# Patient Record
Sex: Female | Born: 1990
Health system: Southern US, Community
[De-identification: ages and names within clinical notes are randomized; demographics above are authoritative.]

## PROBLEM LIST (undated history)

## (undated) DIAGNOSIS — J302 Other seasonal allergic rhinitis: Secondary | ICD-10-CM

## (undated) DIAGNOSIS — F419 Anxiety disorder, unspecified: Secondary | ICD-10-CM

## (undated) DIAGNOSIS — R7302 Impaired glucose tolerance (oral): Secondary | ICD-10-CM

## (undated) HISTORY — DX: Other seasonal allergic rhinitis: J30.2

## (undated) HISTORY — PX: TONSILLECTOMY AND ADENOIDECTOMY: SUR1326

## (undated) HISTORY — DX: Anxiety disorder, unspecified: F41.9

## (undated) HISTORY — DX: Impaired glucose tolerance (oral): R73.02

---
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
## 2012-06-28 ENCOUNTER — Ambulatory Visit: Payer: Self-pay | Admitting: Specialist

## 2012-07-21 ENCOUNTER — Ambulatory Visit: Payer: Self-pay | Admitting: Specialist

## 2012-07-27 ENCOUNTER — Ambulatory Visit: Payer: Self-pay | Admitting: Specialist

## 2012-08-27 ENCOUNTER — Ambulatory Visit: Payer: Self-pay | Admitting: Specialist

## 2012-08-27 HISTORY — PX: POLYSOMNOGRAPHY: SHX453

## 2012-09-04 ENCOUNTER — Ambulatory Visit (INDEPENDENT_AMBULATORY_CARE_PROVIDER_SITE_OTHER): Payer: 59 | Admitting: Family Medicine

## 2012-09-04 ENCOUNTER — Encounter: Payer: Self-pay | Admitting: Family Medicine

## 2012-09-04 VITALS — BP 137/85 | HR 79 | Temp 98.2°F | Resp 16 | Ht 67.0 in | Wt 293.0 lb

## 2012-09-04 DIAGNOSIS — E270 Other adrenocortical overactivity: Secondary | ICD-10-CM

## 2012-09-04 DIAGNOSIS — F411 Generalized anxiety disorder: Secondary | ICD-10-CM

## 2012-09-04 DIAGNOSIS — E669 Obesity, unspecified: Secondary | ICD-10-CM

## 2012-09-04 DIAGNOSIS — R946 Abnormal results of thyroid function studies: Secondary | ICD-10-CM

## 2012-09-04 DIAGNOSIS — R7989 Other specified abnormal findings of blood chemistry: Secondary | ICD-10-CM

## 2012-09-04 DIAGNOSIS — R7309 Other abnormal glucose: Secondary | ICD-10-CM

## 2012-09-04 MED ORDER — ALPRAZOLAM 0.5 MG PO TABS: 0.5000 mg | ORAL_TABLET | Freq: Every evening | ORAL | Status: DC | PRN

## 2012-09-04 NOTE — Progress Notes (Signed)
[address]   [place], [place]  [address]   [phone]  Subjective:    Patient ID: Jill Ortiz, female    DOB: 08-25-1990, 22 y.o.   MRN: [id number]  HPI This 22 y.o. female presents to establish care and for the following concerns:  1.  Bariatric surgery:  Scheduled for October 10, 2012.  Working with Eunice Blase.  Dr. Smitty Cords in [place] to complete surgery.  Had to complete pre-operative requirements at office in [place]; surgery at [facility].  +Gastric Bypass.  Will be out of work for two weeks.  Three weeks of one meal a day and the remainder of meals protein shakes.    2.  Thyroid abnormality:  TSH in 06/2012 of 5.770.  Grandmother with hypothyroidism.  Has suffered with borderline thyroid readings for a few years.  Surgeon worried that thyroid abnormality will interfere with weight loss after gastric bypass.  Cortisol level was slightly elevated on recent labs; asked Dr. Sullivan Lone if that indicated anything; he suggested that there may be issue with pituitary-hypothalamic process.    3.  Glucose Intolerance:  Recent labs did reveal glucose intolerance.  This further motivates patient to undergo gastric bypass for weight loss.  Wants to stay healthy and avoid developing obesity related health issues.  4.  Anxiety:  Zoloft x 1 year after death of mother and weaned off; taking Lorazepam 1-2 times per month.  Has never taken Xanax.  Has a new nephew.  Things going well now; coping with stressors well.  Has a serious boyfriend.  Good family support.  5. Health Maintenance:  Pap smear scheduled today; Levin Erp.  Plans to do IUD. Mammogram never. Colonoscopy never. TDAP 2010. Flu vaccine 2013. Hepatitis B series in past 6th grade. Eye exam 2012; no contacts; +reading glasses. Dental exam 6 months ago.   Review of Systems  Constitutional: Positive for unexpected weight change. Negative for fever, chills, diaphoresis, activity change, appetite change and fatigue.  Respiratory: Negative for  shortness of breath.   Cardiovascular: Negative for chest pain, palpitations and leg swelling.  Gastrointestinal: Negative for nausea, vomiting, abdominal pain, diarrhea and constipation.  Endocrine: Negative for cold intolerance, heat intolerance, polydipsia, polyphagia and polyuria.  Musculoskeletal: Negative for back pain, arthralgias and gait problem.  Skin: Negative for rash.  Neurological: Negative for dizziness, seizures, syncope, facial asymmetry, speech difficulty, light-headedness, numbness and headaches.  Psychiatric/Behavioral: Negative for confusion, sleep disturbance, dysphoric mood and decreased concentration. The patient is not nervous/anxious.     Past Medical History  Diagnosis Date  . Seasonal allergies   . Anxiety   . Glucose intolerance (impaired glucose tolerance)     Past Surgical History  Procedure Laterality Date  . Tonsillectomy and adenoidectomy      Prior to Admission medications   Medication Sig Start Date End Date Taking? Authorizing Provider  LORazepam (ATIVAN) 0.5 MG tablet Take 0.5 mg by mouth every 8 (eight) hours.   Yes Historical Provider, MD  norethindrone-ethinyl estradiol (JUNEL FE,GILDESS FE,LOESTRIN FE) 1-20 MG-MCG tablet Take 1 tablet by mouth daily.   Yes Historical Provider, MD  ALPRAZolam Prudy Feeler) 0.5 MG tablet Take 1 tablet (0.5 mg total) by mouth at bedtime as needed for sleep. 09/04/12   Ethelda Chick, MD    No Known Allergies  History   Social History  . Marital Status: Single    Spouse Name: N/A    Number of Children: N/A  . Years of Education: N/A   Occupational History  . CMA  [employer]   Social History Main Topics  . Smoking status: Never Smoker   . Smokeless tobacco: Not on file  . Alcohol Use: Yes  . Drug Use: No  . Sexually Active: Not on file   Other Topics Concern  . Not on file   Social History Narrative   Marital status: dating seriously x 2 years.      Children; none      Lives: with dad, boyfriend.       Employment: [employer] x 2 years; happy.  Works with Eunice Blase       Tobacco: none      Alcohol: socially      Drugs; none      Exercise:  1-2 times per week.    Family History  Problem Relation Age of Onset  . Diabetes Mother   . Hyperlipidemia Mother   . Hypertension Mother   . COPD Mother   . Heart disease Mother     heart arrhythmia; V. Tach.  CHF  . Diabetes Maternal Grandmother   . Diabetes Paternal Grandmother   . Hyperlipidemia Father   .          Objective:   Physical Exam  Nursing note and vitals reviewed. Constitutional: She is oriented to person, place, and time. She appears well-developed and well-nourished. No distress.  HENT:  Head: Normocephalic and atraumatic.  Right Ear: External ear normal.  Left Ear: External ear normal.  Nose: Nose normal.  Mouth/Throat: Oropharynx is clear and moist.  Eyes: Conjunctivae and EOM are normal. Pupils are equal, round, and reactive to light.  Neck: Normal range of motion. Neck supple. No JVD present. No thyromegaly present.  Cardiovascular: Normal rate, regular rhythm, normal heart sounds and intact distal pulses.  Exam reveals no gallop and no friction rub.   No murmur heard. Pulmonary/Chest: Effort normal and breath sounds normal. She has no wheezes. She has no rales.  Abdominal: Soft. Bowel sounds are normal. She exhibits no mass. There is no tenderness. There is no rebound and no guarding.  Musculoskeletal:       Right shoulder: Normal.       Left shoulder: Normal.       Cervical back: Normal.       Lumbar back: Normal.  Lymphadenopathy:    She has no cervical adenopathy.  Neurological: She is alert and oriented to person, place, and time. She has normal reflexes. No cranial nerve deficit. She exhibits normal muscle tone. Coordination normal.  Skin: Skin is warm and dry. No rash noted. She is not diaphoretic.  Psychiatric: She has a normal mood and affect. Her behavior is normal. Judgment and thought content normal.        Assessment & Plan:  Thyroid function study abnormality - Plan: Ambulatory referral to Endocrinology  Elevated cortisol level - Plan: Ambulatory referral to Endocrinology  Other abnormal glucose  Generalized anxiety disorder  Obesity, unspecified   1. Thyroid abnormality:  Chronic/persistent; mild with minimal to no symptoms; with elevated Cortisol level, recommend referral to endocrinology. 2.  Elevated Cortisol Level:  New. Associated with slightly abnormal TSH; refer to endocrinology to rule out underlying process. 3.  Glucose intolerance:  New.  Recommend weight loss, exercise, low-carb and low-sugar intake.  Scheduled for gastric bypass 10/10/12. 4.  Generalized anxiety disorder: improved recently; has weaned off of Zoloft; requesting trial of Xanax for sparing use.  Rx provided.  To replace Ativan. 5. Obesity: chronic condition; scheduled for gastric bypass on 10/10/12; good  family support . Encourage surgery.  Meds ordered this encounter  Medications  . norethindrone-ethinyl estradiol (JUNEL FE,GILDESS FE,LOESTRIN FE) 1-20 MG-MCG tablet    Sig: Take 1 tablet by mouth daily.  Marland Kitchen LORazepam (ATIVAN) 0.5 MG tablet    Sig: Take 0.5 mg by mouth every 8 (eight) hours.  . ALPRAZolam (XANAX) 0.5 MG tablet    Sig: Take 1 tablet (0.5 mg total) by mouth at bedtime as needed for sleep.    Dispense:  30 tablet    Refill:  2

## 2012-09-12 ENCOUNTER — Telehealth: Payer: Self-pay

## 2012-09-12 NOTE — Telephone Encounter (Signed)
PT STATES SHE IS HAVING GASTRIC BYPASS SURGERY AND NEED A LETTER SAYING HER TSH LEVEL WON'T AFFECT THE SURGERY PLEASE CALL [phone]

## 2012-09-13 NOTE — Telephone Encounter (Signed)
Generated note regarding thyroid function; letter sent to patient via MyChart.  Also contacted patient for fax number; she does not have a fax number to fax letter; she will personally fax letter or contact office if she needs it faxed directly from [facility].  Has appointment in three weeks with endocrinology; hopes to undergo gastric bypass in four week.s

## 2012-09-13 NOTE — Telephone Encounter (Signed)
Pt is calling back stating that she is in need of the letter mentioned in the previous message asap. Best# [phone]

## 2012-09-26 ENCOUNTER — Ambulatory Visit: Payer: Self-pay | Admitting: Specialist

## 2012-09-26 HISTORY — PX: OTHER SURGICAL HISTORY: SHX169

## 2012-10-02 ENCOUNTER — Encounter: Payer: Self-pay | Admitting: Internal Medicine

## 2012-10-02 ENCOUNTER — Ambulatory Visit (INDEPENDENT_AMBULATORY_CARE_PROVIDER_SITE_OTHER): Payer: 59 | Admitting: Internal Medicine

## 2012-10-02 VITALS — BP 112/68 | HR 90 | Temp 98.2°F | Resp 12 | Ht 66.25 in | Wt 288.0 lb

## 2012-10-02 DIAGNOSIS — E039 Hypothyroidism, unspecified: Secondary | ICD-10-CM | POA: Insufficient documentation

## 2012-10-02 NOTE — Patient Instructions (Addendum)
Please return in 3 months. At that time, we might need to check for excess cortisol production (by a 24h urine collection). I will let you know the results of your tests by MyChart.

## 2012-10-02 NOTE — Progress Notes (Addendum)
Subjective:     Patient ID: Jill Ortiz, female   DOB: Apr 14, 1990, 22 y.o.   MRN: [id number]  HPI Ms. Jill Ortiz is a pleasant 22 y/o woman referred by her PCP, Dr. Nilda Simmer at [facility] for management of hypothyrodism. The patient has an upcoming gastric bypass surgery scheduled for a week from now.  Patient has been diagnosed with hypothyroidism in 2009, but she was not treated at that time as she did not have a PCP then;  in 06/2012, when the TSH returned at 5.770. She remembers that the TSH value was lower than this in 2009.  Pt c/o: - weight gain, but recent weight loss (15 lbs in last mo per our records): fluctuated weight all her life, could lose it but regained it; gained 35 lbs since her mom diet, lost 10-15 lbs on recent diet that prepares her for the upcoming GBP. She also exercises by walking 2x a week. - no constipation - + alternates between cold and hot intolerance (but usually feels hot) - no dry skin - + breaking fingernails - no hair falling - no mental fogginess - no hypersomnia  No lumps in throat, hoarseness, odyno-/dysphagia.   She has a family history of hypothyroidism in her grandmother. No known FH of autoimmune disorders.  The patient also had a slightly elevated 8 am cortisol level of 26.5 (6.2-19.4) 3 months ago. Of note, at that time, she was on oral contraceptives, she stopped them afterwards, and now she has Mirena IUD in place for the last week.  She denies sudden weight gain, increased anxiety/depression lately - she has depression after the death of her mom in January 28, 2013and was on SSRI for one year, but she took herself off them approximately 6 months ago, hypertension, hyperlipidemia, wide purple striae, isolated central obesity, acne, hirsutism, or irregular menstrual cycles prior to starting the contraceptives. She does have impaired fasting glucose, and an extensive family history of diabetes.  PMH: She also has a medical history of  generalized anxiety disorder-on benzodiazepines, obesity-with a BMI of 45 and the gastric bypass (R en Y) scheduled on 10/10/2012 with Dr. Smitty Cords in [place], hyperglycemia.  Patient brings the results from 07/17/2012 at this appointment, and we reviewed them together: Hemoglobin A1c 5.9% 25-hydroxy vitamin D 40 Vitamin B1 129 Amylase, lipase, magnesium, ferritin, pre-albumin, H. pylori antibodies, Ferritin, INR, phosphorus, B12, and folate are all normal CMP normal except glucose high at 128 (fasting) CBC with differential normal  Review of Systems Constitutional: + weight gain/loss, + fatigue, no subjective hyperthermia/hypothermia Eyes: no blurry vision, no xerophthalmia ENT: no sore throat, no nodules palpated in throat, no dysphagia/odynophagia, no hoarseness Cardiovascular: no CP/SOB/palpitations/leg swelling Respiratory: no cough/SOB Gastrointestinal: no N/V/D/C Musculoskeletal: no muscle/joint aches Skin: no rashes Neurological: no tremors/numbness/tingling/dizziness Psychiatric: + depression/anxiety  Past Medical History  Diagnosis Date  . Seasonal allergies   . Anxiety   . Glucose intolerance (impaired glucose tolerance)    Past Surgical History  Procedure Laterality Date  . Tonsillectomy and adenoidectomy     History   Social History  . Marital Status: Single    Spouse Name: N/A    Number of Children: 0   Occupational History  . CMA at [employer]   Social History Main Topics  . Smoking status: Never Smoker   . Smokeless tobacco: Not on file  . Alcohol Use: Yes  . Drug Use: No   Social History Narrative   Marital status: dating seriously  x 2 years.      Children; none      Lives: with dad, boyfriend.      Employment: [employer] x 2 years; happy.  Works with Eunice Blase       Tobacco: none      Alcohol: socially      Drugs; none      Exercise:  1-2 times per week.   Current Outpatient Prescriptions on File Prior to Visit  Medication  Sig Dispense Refill  . ALPRAZolam (XANAX) 0.5 MG tablet Take 1 tablet (0.5 mg total) by mouth at bedtime as needed for sleep.  30 tablet  2  . LORazepam (ATIVAN) 0.5 MG tablet Take 0.5 mg by mouth every 8 (eight) hours.      . norethindrone-ethinyl estradiol (JUNEL FE,GILDESS FE,LOESTRIN FE) 1-20 MG-MCG tablet Take 1 tablet by mouth daily.       No current facility-administered medications on file prior to visit.   No Known Allergies  Family History  Problem Relation Age of Onset  . Diabetes Mother   . Hyperlipidemia Mother   . Hypertension Mother   . COPD Mother   . Heart disease Mother     heart arrhythmia; V. Tach.  CHF  . Diabetes Maternal Grandmother   . Diabetes Paternal Grandmother   . Hyperlipidemia Father   .      Objective:   Physical Exam BP 112/68  Pulse 90  Temp(Src) 98.2 F (36.8 C) (Oral)  Resp 12  Ht 5' 6.25" (1.683 m)  Wt 288 lb (130.636 kg)  BMI 46.12 kg/m2  SpO2 98%  LMP 09/21/2012 Wt Readings from Last 3 Encounters:  10/02/12 288 lb (130.636 kg)  09/04/12 293 lb (132.904 kg)  Lost 15 lbs in last month!  Constitutional: obese, in NAD, no full [place] fat pads, no facial plethora Eyes: PERRLA, EOMI, no exophthalmos ENT: moist mucous membranes, no thyromegaly, no cervical lymphadenopathy Cardiovascular: RRR, No MRG Respiratory: CTA B Gastrointestinal: abdomen soft, NT, ND, BS+ Musculoskeletal: no deformities, strength intact in all 4 Skin: moist, warm, no rashes Neurological: no tremor with outstretched hands, DTR normal in all 4    Assessment:     1. Hypothyroidism  07/17/2012: TSH 5.770  2. Elevated cortisol level 07/17/2012: 8 am cortisol level of 26.5 (6.2-19.4)     Plan:     1. Hypothyroidism - Pt with previous h/o mild hypothyroidism vs subclinical hypothyroidism in 2009, with a recent TSH that was above the upper limits of normal. Free T4 and free T3 were not tested 3 months ago, so it's unclear whether this is clinical versus subclinical  hypothyroidism. - I discussed with the patient that the degree of hypothyroidism associated with a TSH of 5.8 is mild, and most likely would not have an effect on her weight after gastric bypass, and I would like to repeat the measurement, with associated free t3, free T4, and antiTPO antibodies  - we discussed about the fact that:  if the TSH is normal, then we will watch her thyroid tests expectantly.   If this is subclinical hypothyroidism, depending on the degree, we will continue to watch the tests expectantly or treat if TSH high.   If this is overt hypothyroidism, we will start levothyroxine.  Patient agrees with the plan. We'll schedule an appointment in 3 months from now.  2. Increased am cortisol level - This is not an unusual finding in a woman on oral contraceptives, due to increased cortisol binding globulin by estrogen - She  does not have any features of Cushing syndrome (please see HPI). Her obesity is not exclusively central. We discussed about the finding and possible explanation. I plan to see her back in 3 months, and if she cannot lose weight after her gastric bypass or if there are any other concerning signs/symptoms of exaggerated cortisol secretion, will check a 24-hour urine cortisol level then.  Office Visit on 10/02/2012  Component Date Value Range Status  . TSH 10/02/2012 2.53  0.35 - 5.50 uIU/mL Final  . Free T4 10/02/2012 0.71  0.60 - 1.60 ng/dL Final  . T3, Free 16/12/9602 2.6  2.3 - 4.2 pg/mL Final   Msg sent; Dear Ms Jill Ortiz, Thyroid tests are normal so far, antibodies still pending. No need for thyroid hormone supplementation so far. Please let me know if you have any questions. Sincerely, Carlus Pavlov MD  . Thyroid Peroxidase Antibody 10/02/2012 15.3  <35.0 IU/mL Final   Comment:                            The thyroid microsomal antigen has been shown to be Thyroid                          Peroxidase (TPO).  This assay detects anti-TPO antibodies.    Msg sent.

## 2012-10-03 LAB — THYROID PEROXIDASE ANTIBODY: Thyroperoxidase Ab SerPl-aCnc: 15.3 IU/mL (ref <35.0)

## 2012-10-12 ENCOUNTER — Ambulatory Visit: Payer: Self-pay | Admitting: Specialist

## 2012-10-19 ENCOUNTER — Inpatient Hospital Stay: Payer: Self-pay | Admitting: Specialist

## 2012-10-19 HISTORY — PX: GASTRIC BYPASS: SHX52

## 2012-10-20 LAB — BASIC METABOLIC PANEL
BUN: 5 mg/dL — ABNORMAL LOW (ref 7–18)
Calcium, Total: 8.5 mg/dL (ref 8.5–10.1)
Chloride: 109 mmol/L — ABNORMAL HIGH (ref 98–107)
Co2: 22 mmol/L (ref 21–32)
Glucose: 100 mg/dL — ABNORMAL HIGH (ref 65–99)
Osmolality: 275 (ref 275–301)
Potassium: 4 mmol/L (ref 3.5–5.1)
Sodium: 139 mmol/L (ref 136–145)

## 2012-10-20 LAB — CBC WITH DIFFERENTIAL/PLATELET
Basophil #: 0 10*3/uL (ref 0.0–0.1)
Basophil %: 0.1 %
Eosinophil %: 0.2 %
HCT: 38.3 % (ref 35.0–47.0)
HGB: 12.9 g/dL (ref 12.0–16.0)
MCV: 88 fL (ref 80–100)
Monocyte #: 0.6 x10 3/mm (ref 0.2–0.9)
Monocyte %: 6.8 %
Neutrophil %: 82.5 %
RBC: 4.36 10*6/uL (ref 3.80–5.20)
WBC: 9.1 10*3/uL (ref 3.6–11.0)

## 2012-10-20 LAB — MAGNESIUM: Magnesium: 1.7 mg/dL — ABNORMAL LOW

## 2012-10-20 LAB — ALBUMIN: Albumin: 3.2 g/dL — ABNORMAL LOW (ref 3.4–5.0)

## 2012-10-20 LAB — PHOSPHORUS: Phosphorus: 2.2 mg/dL — ABNORMAL LOW (ref 2.5–4.9)

## 2012-11-10 ENCOUNTER — Ambulatory Visit: Payer: Self-pay | Admitting: Specialist

## 2012-11-27 ENCOUNTER — Ambulatory Visit: Payer: Self-pay | Admitting: Specialist

## 2012-12-04 ENCOUNTER — Ambulatory Visit (INDEPENDENT_AMBULATORY_CARE_PROVIDER_SITE_OTHER): Payer: 59 | Admitting: Family Medicine

## 2012-12-04 ENCOUNTER — Encounter: Payer: Self-pay | Admitting: Family Medicine

## 2012-12-04 DIAGNOSIS — F411 Generalized anxiety disorder: Secondary | ICD-10-CM

## 2012-12-04 DIAGNOSIS — Z9884 Bariatric surgery status: Secondary | ICD-10-CM

## 2012-12-04 DIAGNOSIS — E039 Hypothyroidism, unspecified: Secondary | ICD-10-CM

## 2012-12-04 MED ORDER — LORAZEPAM 0.5 MG PO TABS: 0.5000 mg | ORAL_TABLET | Freq: Three times a day (TID) | ORAL | Status: DC

## 2012-12-04 MED ORDER — ALPRAZOLAM 0.5 MG PO TABS: 0.5000 mg | ORAL_TABLET | Freq: Every evening | ORAL | Status: DC | PRN

## 2012-12-04 NOTE — Progress Notes (Signed)
[address]   [place], [place]  [address]   [phone]  Subjective:    Patient ID: Jill Ortiz, female    DOB: 01/27/1991, 22 y.o.   MRN: [id number]  HPI This 22 y.o. female presents for post-operative evaluation of recent gastric bypass on 10/19/12 for obesity:  1.  Obesity: s/p gastric bypass on 10/19/12.   B:  Cheese, cup of fruit.  No nuts, seeds hard.  No salad for 3-4 weeks after surgery.  Had to drink protein shakes for one week before surgery. Snack:   Returned to work on [employer] duty in one week; triage. No snacks last week. L:  Eats protein first; isopures clear liquid [employer] on [address] across is [employer]; chicken.  Can tolerate bread; ate pudding at work and had dumping syndrome.  Wedding over the weekend. Supper:  Chicken, green beans.  Cleared to eat corn.  Cleared to eat tomatoes.  Meeting with nutritionist one week after surgery; only mandatory.  Recommend nutritionist every three months at [facility].  No support group meetings; has not attended.One post-op visit two weeks out and appointment in October.  Gets hiccups when full; gets a tight sensation and then hiccups develop. 60 ounces of fluid daily is the goal.  Water.  No soda recommended; diet Mt. Dew.  Advised to drink between meal. Taste has drastically changed.   Has cravings.  Gets thirsty.   NPO for 24 hours after surgery. Starting weight 293. Day of surgery 271.  Weight currently 258.   Taking Calcium, Bariatric chewable one a day, B complex, stool softener, hair and nail biotin tablets, Vitamin B12 sublingual.   Goal 160-170.  Goal of 150 pounds from start.  Cleared to exercise at one month out.   3 month follow-up with general surgery in 12/2012.     2.  TSH abnormality: s/p endocrinology consultation.  Repeat TSH normal.   No follow-up due to normal TSH.  Felt elevated cortisol due to OCP; further testing negative.    3.  Anxiety: stable despite recent surgery; was suffering with  increase anxiety the week before surgery; using Xanax at increased frequency at that time; needs refill on Xanax.   Review of Systems  Constitutional: Positive for appetite change. Negative for fever, chills, diaphoresis, activity change and fatigue.  Respiratory: Negative for cough, shortness of breath, wheezing and stridor.   Cardiovascular: Negative for chest pain, palpitations and leg swelling.  Gastrointestinal: Positive for constipation. Negative for nausea, vomiting, abdominal pain and diarrhea.  Endocrine: Negative for cold intolerance, heat intolerance, polydipsia, polyphagia and polyuria.  Psychiatric/Behavioral: Negative for sleep disturbance and dysphoric mood. The patient is nervous/anxious.    Past Medical History  Diagnosis Date  . Seasonal allergies   . Anxiety   . Glucose intolerance (impaired glucose tolerance)    Past Surgical History  Procedure Laterality Date  . Tonsillectomy and adenoidectomy    . Gastric bypass  10/19/2012    [facility].  . Polysomnography  08/27/2012    no OSA.  Pre-operative clearance for gastric bypass.  . Endocrinology consultation  09/26/2012    TSH slightly elevated, elevated cortisol.  Repeat labs normal.  Elevated cortisol due to OCPs.  No treatment warranted.   No Known Allergies Current Outpatient Prescriptions on File Prior to Visit  Medication Sig Dispense Refill  . norethindrone-ethinyl estradiol (JUNEL FE,GILDESS FE,LOESTRIN FE) 1-20 MG-MCG tablet Take 1 tablet by mouth daily.       No current facility-administered medications on file  prior to visit.   History   Social History  . Marital Status: Single    Spouse Name: N/A    Number of Children: N/A  . Years of Education: N/A   Occupational History  . CMA    Social History Main Topics  . Smoking status: Never Smoker   . Smokeless tobacco: Not on file  . Alcohol Use: Yes  . Drug Use: No  . Sexual Activity: Yes    Birth Control/ Protection: Pill   Other Topics  Concern  . Not on file   Social History Narrative   Marital status: dating seriously x 2 years.      Children; none      Lives: with dad, boyfriend.      Employment: [employer] x 2 years; happy.  Works with Eunice Blase       Tobacco: none      Alcohol: socially      Drugs; none      Exercise:  1-2 times per week.       Objective:   Physical Exam  Nursing note and vitals reviewed. Constitutional: She is oriented to person, place, and time. She appears well-developed and well-nourished. No distress.  HENT:  Head: Normocephalic and atraumatic.  Mouth/Throat: Oropharynx is clear and moist.  Eyes: Conjunctivae and EOM are normal. Pupils are equal, round, and reactive to light.  Neck: Normal range of motion. Neck supple. No thyromegaly present.  Cardiovascular: Normal rate, regular rhythm, normal heart sounds and intact distal pulses.  Exam reveals no gallop and no friction rub.   No murmur heard. Pulmonary/Chest: Effort normal and breath sounds normal. She has no wheezes. She has no rales.  Abdominal: Soft. Bowel sounds are normal. She exhibits no distension and no mass. There is no tenderness. There is no rebound and no guarding.  Well healed incisions abdomen.  Lymphadenopathy:    She has no cervical adenopathy.  Neurological: She is alert and oriented to person, place, and time.  Skin: Skin is warm and dry. No rash noted. She is not diaphoretic. No erythema.  Psychiatric: She has a normal mood and affect. Her behavior is normal. Judgment and thought content normal.      Assessment & Plan:  Severe obesity (BMI >= 40)  Unspecified hypothyroidism  Gastric bypass status for obesity  Anxiety state, unspecified   1.  Obesity: improving s/p gastric bypass with 35 pound weight loss.   2.  S/p gastric bypass: New.  Post-operatively five weeks; coping well with new diet and lifestyle.  Appointment in one month with general surgery; also to follow-up with general surgery at six months  post-operative.  To follow-up with PCP at six months to confirm that labs completed.  Tolerating diet well. 3.  Hypothyroidism:  Mild.  No treatment warranted; s/p endocrinology consultation. 4.  Anxiety: stable despite recent surgery; refill of Xanax provided.

## 2013-01-08 ENCOUNTER — Ambulatory Visit: Payer: 59 | Admitting: Family Medicine

## 2013-01-08 ENCOUNTER — Encounter: Payer: Self-pay | Admitting: Family Medicine

## 2013-01-22 ENCOUNTER — Ambulatory Visit: Payer: Self-pay | Admitting: Specialist

## 2013-02-01 ENCOUNTER — Other Ambulatory Visit: Payer: Self-pay

## 2013-03-06 ENCOUNTER — Encounter: Payer: 59 | Admitting: Family Medicine

## 2013-04-03 ENCOUNTER — Ambulatory Visit: Payer: 59 | Admitting: Family Medicine

## 2013-04-25 ENCOUNTER — Encounter: Payer: 59 | Admitting: Family Medicine

## 2013-08-06 ENCOUNTER — Ambulatory Visit (INDEPENDENT_AMBULATORY_CARE_PROVIDER_SITE_OTHER): Payer: 59 | Admitting: Family Medicine

## 2013-08-06 ENCOUNTER — Encounter: Payer: Self-pay | Admitting: Family Medicine

## 2013-08-06 VITALS — BP 124/76 | HR 74 | Temp 98.7°F | Resp 16 | Ht 66.0 in | Wt 227.4 lb

## 2013-08-06 DIAGNOSIS — E669 Obesity, unspecified: Secondary | ICD-10-CM

## 2013-08-06 DIAGNOSIS — N760 Acute vaginitis: Secondary | ICD-10-CM

## 2013-08-06 DIAGNOSIS — N898 Other specified noninflammatory disorders of vagina: Secondary | ICD-10-CM

## 2013-08-06 DIAGNOSIS — B9689 Other specified bacterial agents as the cause of diseases classified elsewhere: Secondary | ICD-10-CM

## 2013-08-06 DIAGNOSIS — Z9884 Bariatric surgery status: Secondary | ICD-10-CM

## 2013-08-06 DIAGNOSIS — R946 Abnormal results of thyroid function studies: Secondary | ICD-10-CM

## 2013-08-06 DIAGNOSIS — R7309 Other abnormal glucose: Secondary | ICD-10-CM

## 2013-08-06 LAB — CBC WITH DIFFERENTIAL/PLATELET
Basophils Absolute: 0 10*3/uL (ref 0.0–0.1)
Basophils Relative: 0 % (ref 0–1)
Eosinophils Absolute: 0.1 10*3/uL (ref 0.0–0.7)
Eosinophils Relative: 1 % (ref 0–5)
HCT: 41.7 % (ref 36.0–46.0)
HEMOGLOBIN: 14.2 g/dL (ref 12.0–15.0)
LYMPHS PCT: 32 % (ref 12–46)
Lymphs Abs: 2 10*3/uL (ref 0.7–4.0)
MCH: 30.2 pg (ref 26.0–34.0)
MCHC: 34.1 g/dL (ref 30.0–36.0)
MCV: 88.7 fL (ref 78.0–100.0)
Monocytes Absolute: 0.4 10*3/uL (ref 0.1–1.0)
Monocytes Relative: 6 % (ref 3–12)
Neutro Abs: 3.9 10*3/uL (ref 1.7–7.7)
Neutrophils Relative %: 61 % (ref 43–77)
PLATELETS: 262 10*3/uL (ref 150–400)
RBC: 4.7 MIL/uL (ref 3.87–5.11)
RDW: 13.6 % (ref 11.5–15.5)
WBC: 6.4 10*3/uL (ref 4.0–10.5)

## 2013-08-06 LAB — POCT WET PREP WITH KOH
KOH PREP POC: NEGATIVE
Trichomonas, UA: NEGATIVE
Yeast Wet Prep HPF POC: NEGATIVE

## 2013-08-06 LAB — COMPLETE METABOLIC PANEL WITH GFR
ALBUMIN: 4.8 g/dL (ref 3.5–5.2)
ALT: 20 U/L (ref 0–35)
AST: 22 U/L (ref 0–37)
Alkaline Phosphatase: 97 U/L (ref 39–117)
BUN: 10 mg/dL (ref 6–23)
CO2: 27 meq/L (ref 19–32)
Calcium: 9.8 mg/dL (ref 8.4–10.5)
Chloride: 103 mEq/L (ref 96–112)
Creat: 0.91 mg/dL (ref 0.50–1.10)
GFR, Est Non African American: 88 mL/min
Glucose, Bld: 89 mg/dL (ref 70–99)
Potassium: 4.2 mEq/L (ref 3.5–5.3)
SODIUM: 139 meq/L (ref 135–145)
TOTAL PROTEIN: 7.2 g/dL (ref 6.0–8.3)
Total Bilirubin: 0.5 mg/dL (ref 0.2–1.2)

## 2013-08-06 LAB — VITAMIN B12: Vitamin B-12: 318 pg/mL (ref 211–911)

## 2013-08-06 LAB — HEMOGLOBIN A1C
Hgb A1c MFr Bld: 5.4 % (ref <5.7)
Mean Plasma Glucose: 108 mg/dL (ref <117)

## 2013-08-06 LAB — TSH: TSH: 2.186 u[IU]/mL (ref 0.350–4.500)

## 2013-08-06 LAB — FOLATE: Folate: >20 ng/mL

## 2013-08-06 LAB — IRON: Iron: 141 ug/dL (ref 42–145)

## 2013-08-06 MED ORDER — METRONIDAZOLE 500 MG PO TABS: 500.0000 mg | ORAL_TABLET | Freq: Two times a day (BID) | ORAL | Status: DC

## 2013-08-06 NOTE — Progress Notes (Signed)
Subjective:  This chart was scribed for  Reginia Forts, MD  by Stacy Gardner, [facility] Scribe. The patient was seen in room and the patient's care was started at 10:28 AM.   Patient ID: Jill Ortiz, female    DOB: April 13, 1990, 23 y.o.   MRN: [id number]  08/06/2013  Follow-up and vaginal irritiation x 4 days   HPI HPI Comments: Jill Ortiz is a 23 y.o. female who arrives to the [facility] for a follow up. Pt lost weight recently s/p gastric bypass. She reports that she feels good and her energy level is good. She did not have any of the labs performed after her last visit. She is drinking more water and try to sip water throughout the day. Pt had a BP reading of 180/70 a few months ago.   She also complains of having vaginal irritation for the past four days. Pt was on Amoxocillin. She has associated burning,itching and white vaginal discharge. Pt's boyfriend had intercourse with another woman and she request a STD test. She is dating a new gentleman and is happy. Pt has an IUD in place. Pt's already had her Gardasil immunization. Pt runs 2-3 miles each week. Denies nausea, vomiting, abdominal pain, and constipation (taking stool softener everyday). Denies vaginal and pelvic pain.    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 She states everything is good on the job.    Review of Systems  Constitutional: Negative for fever, chills, diaphoresis, activity change, appetite change and fatigue.  Gastrointestinal: Negative for nausea, vomiting, abdominal pain and constipation.  Genitourinary: Positive for vaginal discharge. Negative for dysuria, urgency, frequency, hematuria, flank pain, vaginal bleeding, genital sores, vaginal pain, menstrual problem, pelvic pain and dyspareunia.  Psychiatric/Behavioral: Negative for dysphoric mood.    Past Medical History  Diagnosis Date  . Seasonal allergies   . Anxiety   . Glucose  intolerance (impaired glucose tolerance)    No Known Allergies Current Outpatient Prescriptions  Medication Sig Dispense Refill  . ALPRAZolam (XANAX) 0.5 MG tablet Take 1 tablet (0.5 mg total) by mouth at bedtime as needed for sleep. 30 tablet 5  . b complex vitamins tablet Take 1 tablet by mouth daily.    Marland Kitchen BIOTIN PO Take 500 mcg by mouth daily.    . Calcium-Vitamin D-Vitamin K 662-947-65 MG-UNT-MCG CHEW Chew by mouth 2 (two) times daily.    . Cyanocobalamin (VITAMIN B12 PO) Take 2,500 mcg by mouth daily.    Marland Kitchen docusate sodium (COLACE) 100 MG capsule Take 100 mg by mouth daily.    Marland Kitchen levonorgestrel (MIRENA) 20 MCG/24HR IUD 1 each by Intrauterine route once.    . Multiple Vitamin (MULTIVITAMIN) tablet Take 1 tablet by mouth daily.    . metroNIDAZOLE (FLAGYL) 500 MG tablet Take 1 tablet (500 mg total) by mouth 2 (two) times daily. 14 tablet 0  . phentermine 15 MG capsule Take 1 capsule (15 mg total) by mouth every morning. 30 capsule 0   No current facility-administered medications for this visit.       Objective:    BP 124/76 mmHg  Pulse 74  Temp(Src) 98.7 F (37.1 C) (Oral)  Resp 16  Ht '5\' 6"'  (1.676 m)  Wt 227 lb 6.4 oz (103.148 kg)  BMI 36.72 kg/m2  SpO2 100% Physical Exam  Constitutional: She is oriented to person, place, and time. She appears well-developed and well-nourished. No distress.  HENT:  Head:  Normocephalic and atraumatic.  Eyes: Conjunctivae are normal. Pupils are equal, round, and reactive to light.  Neck: Normal range of motion. Neck supple. No thyromegaly present.  Cardiovascular: Normal rate, regular rhythm and normal heart sounds.  Exam reveals no gallop and no friction rub.   No murmur heard. Pulmonary/Chest: Effort normal and breath sounds normal. No respiratory distress. She has no wheezes. She has no rales.  Abdominal: Soft. Bowel sounds are normal. She exhibits no distension and no mass. There is no tenderness. There is no rebound and no guarding.    Genitourinary: Uterus normal. There is no rash, tenderness or lesion on the right labia. There is no rash, tenderness or lesion on the left labia. Cervix exhibits discharge. Cervix exhibits no motion tenderness and no friability. Right adnexum displays no mass, no tenderness and no fullness. Left adnexum displays no mass, no tenderness and no fullness. No erythema or bleeding in the vagina. No foreign body around the vagina. Vaginal discharge found.  Lymphadenopathy:    She has no cervical adenopathy.  Neurological: She is alert and oriented to person, place, and time.  Skin: Skin is warm and dry. She is not diaphoretic.  Psychiatric: She has a normal mood and affect. Her behavior is normal.  Nursing note and vitals reviewed.  Results for orders placed or performed in visit on 08/06/13  GC/Chlamydia Probe Amp  Result Value Ref Range   CT Probe RNA NEGATIVE    GC Probe RNA NEGATIVE   Hemoglobin A1c  Result Value Ref Range   Hgb A1c MFr Bld 5.4 <5.7 %   Mean Plasma Glucose 108 <117 mg/dL  RPR  Result Value Ref Range   RPR NON REAC NON REAC  HIV antibody  Result Value Ref Range   HIV 1&2 Ab, 4th Generation NONREACTIVE NONREACTIVE  CBC with Differential  Result Value Ref Range   WBC 6.4 4.0 - 10.5 K/uL   RBC 4.70 3.87 - 5.11 MIL/uL   Hemoglobin 14.2 12.0 - 15.0 g/dL   HCT 41.7 36.0 - 46.0 %   MCV 88.7 78.0 - 100.0 fL   MCH 30.2 26.0 - 34.0 pg   MCHC 34.1 30.0 - 36.0 g/dL   RDW 13.6 11.5 - 15.5 %   Platelets 262 150 - 400 K/uL   Neutrophils Relative % 61 43 - 77 %   Neutro Abs 3.9 1.7 - 7.7 K/uL   Lymphocytes Relative 32 12 - 46 %   Lymphs Abs 2.0 0.7 - 4.0 K/uL   Monocytes Relative 6 3 - 12 %   Monocytes Absolute 0.4 0.1 - 1.0 K/uL   Eosinophils Relative 1 0 - 5 %   Eosinophils Absolute 0.1 0.0 - 0.7 K/uL   Basophils Relative 0 0 - 1 %   Basophils Absolute 0.0 0.0 - 0.1 K/uL   Smear Review Criteria for review not met   COMPLETE METABOLIC PANEL WITH GFR  Result Value Ref  Range   Sodium 139 135 - 145 mEq/L   Potassium 4.2 3.5 - 5.3 mEq/L   Chloride 103 96 - 112 mEq/L   CO2 27 19 - 32 mEq/L   Glucose, Bld 89 70 - 99 mg/dL   BUN 10 6 - 23 mg/dL   Creat 0.91 0.50 - 1.10 mg/dL   Total Bilirubin 0.5 0.2 - 1.2 mg/dL   Alkaline Phosphatase 97 39 - 117 U/L   AST 22 0 - 37 U/L   ALT 20 0 - 35 U/L   Total Protein  7.2 6.0 - 8.3 g/dL   Albumin 4.8 3.5 - 5.2 g/dL   Calcium 9.8 8.4 - 10.5 mg/dL   GFR, Est African American >89 mL/min   GFR, Est Non African American 88 mL/min  TSH  Result Value Ref Range   TSH 2.186 0.350 - 4.500 uIU/mL  Vitamin B12  Result Value Ref Range   Vitamin B-12 318 211 - 911 pg/mL  Folate  Result Value Ref Range   Folate >20.0 ng/mL  Iron  Result Value Ref Range   Iron 141 42 - 145 ug/dL  Vit D  25 hydroxy (rtn osteoporosis monitoring)  Result Value Ref Range   Vit D, 25-Hydroxy 49 30 - 89 ng/mL  POCT Wet Prep with KOH  Result Value Ref Range   Trichomonas, UA Negative    Clue Cells Wet Prep HPF POC 7-15    Epithelial Wet Prep HPF POC 7-15    Yeast Wet Prep HPF POC neg    Bacteria Wet Prep HPF POC 3+    RBC Wet Prep HPF POC 12-16    WBC Wet Prep HPF POC tntc    KOH Prep POC Negative        Assessment & Plan:  Vaginal discharge - Plan: GC/Chlamydia Probe Amp, RPR, HIV antibody, POCT Wet Prep with KOH  Obesity, unspecified - Plan: CBC with Differential, COMPLETE METABOLIC PANEL WITH GFR, Vitamin B12, Folate, Iron, Vit D  25 hydroxy (rtn osteoporosis monitoring)  Abnormal thyroid function test - Plan: CBC with Differential, COMPLETE METABOLIC PANEL WITH GFR, TSH, Vitamin B12, Folate, Iron  S/P gastric bypass - Plan: COMPLETE METABOLIC PANEL WITH GFR  Other abnormal glucose - Plan: Hemoglobin A1c, COMPLETE METABOLIC PANEL WITH GFR  BV (bacterial vaginosis)   1. Bacterial Vaginosis: New. Rx for Metronidazole provided; avoid alcohol. Obtain GC/Chlam/RPR/HIV. 2. Obesity: improving; s/p gastric bypass. Weight down 70  pounds.  Continue with weight loss; obtain labs. 3.  Thyroid function abnormal: repeat TSH today and normal. 4. Glucose intolerance: improved with weight loss. 5.  S/p gastric bypass: stable; weight down 70 pounds; obtain labs. 6. High risk sexual behavior: obtain GC/Chlam/RPR/HIV.   Meds ordered this encounter  Medications  . metroNIDAZOLE (FLAGYL) 500 MG tablet    Sig: Take 1 tablet (500 mg total) by mouth 2 (two) times daily.    Dispense:  14 tablet    Refill:  0    No Follow-up on file.    I personally performed the services described in this documentation, which was scribed in my presence.  The recorded information has been reviewed and is accurate.  Reginia Forts, M.D.  [facility] [address] [place], [place]  [address] [phone] phone [phone] fax

## 2013-08-07 LAB — GC/CHLAMYDIA PROBE AMP
CT PROBE, AMP APTIMA: NEGATIVE
GC PROBE AMP APTIMA: NEGATIVE

## 2013-08-07 LAB — VITAMIN D 25 HYDROXY (VIT D DEFICIENCY, FRACTURES): Vit D, 25-Hydroxy: 49 ng/mL (ref 30–89)

## 2013-08-07 LAB — RPR

## 2013-08-07 LAB — HIV ANTIBODY (ROUTINE TESTING W REFLEX): HIV: NONREACTIVE

## 2013-08-09 ENCOUNTER — Encounter: Payer: Self-pay | Admitting: Family Medicine

## 2013-10-26 ENCOUNTER — Encounter: Payer: Self-pay | Admitting: Family Medicine

## 2013-10-31 ENCOUNTER — Encounter: Payer: Self-pay | Admitting: Family Medicine

## 2013-11-01 MED ORDER — PHENTERMINE HCL 15 MG PO CAPS: 15.0000 mg | ORAL_CAPSULE | ORAL | Status: DC

## 2013-11-01 NOTE — Telephone Encounter (Signed)
Please call in or fax in rx for Phentermine.

## 2013-11-01 NOTE — Telephone Encounter (Signed)
Faxed Rx and pt already notified in [place].

## 2014-01-28 NOTE — Progress Notes (Signed)
This encounter was created in error - please disregard.

## 2014-05-31 LAB — HM PAP SMEAR: HM PAP: NEGATIVE

## 2014-07-19 NOTE — Op Note (Signed)
PATIENT NAME:  Jill Ortiz, PEBLEY MR#:  [id number] DATE OF BIRTH:  [birth year]  DATE OF PROCEDURE:  10/19/2012  PREOPERATIVE DIAGNOSES: Morbid obesity.   POSTOPERATIVE DIAGNOSIS: Morbid obesity.   PROCEDURE: Laparoscopic Roux-en-Y gastric bypass.   SURGEON: Kreg Shropshire, MD.  ASSISTANT: Valaria Good, PA-C.   ANESTHESIA: General endotracheal.   FINDINGS: No significant hiatal hernia.   CLINICAL HISTORY: See history and physical.   DETAILS OF THE PROCEDURE:  The patient was taken to the operating room and placed on the operating table in the supine position.  Appropriate monitors and supplemental oxygen were delivered.  Broad-spectrum IV antibiotics were administered.  Sequential stockings were placed.  The abdomen was prepped and draped in the usual sterile fashion after the smooth induction of general anesthesia.  The abdomen was accessed using a 5-ml optical trocar in the left upper quadrant.  Pneumoperitoneum was established without difficulty.  Multiple other ports were placed in preparation for gastric bypass.  The small bowel was identified at the ligament of Treitz and run for 50 cm distal to that point.  It was then divided using an Echelon white load stapler reinforced with seam guard.  The Harmonic scalpel was used to take down the mesentery to its base.  A _________________ cm Roux limb was then created and a side-to-side jejunojejunostomy was created in the following fashion.  A tacking stitch was used using 2-0 Surgidac suture.  An enterotomy was made in both limbs and the Echelon white load stapler was inserted to its hub, clamped, and fired for its full 6-cm length.  The ensuing defect was closed using interrupted sutures to retract the enterotomy up and stapled across with the Echelon blue-load stapler with good effect.  A baby's butt stitch was placed times one using 2-0 Surgidac suture.  A stay suture was placed anteriorly using a 2-0 Surgidac suture and the mesenteric defect was closed  using running 2-0 Surgidac suture.  The omentum was divided then in the midline to create a path for the Roux limb to be lifted and elevated up to the gastric pouch.  The liver retractor was placed and the liver was retracted anteriorly and cephalad.  The patient was placed in steep reverse Trendelenburg after the Roux limb was tacked to the underside of the stomach using a 2-0 Surgidac suture.  All structures were removed from the stomach.  The pars flaccida was taken down using a Harmonic scalpel to the second gastric vein.  At that point the Echelon gold load reinforced with seam guard was used to fire multiple times creating a small 30 to 45-ml pouch.  Hemostasis was excellent.  The posterior pouch was then freed of all fibrofatty tissue in preparation for anastomosis.  The small bowel Roux limb was then mobilized up into the upper abdomen and tacked to the left side of the pouch with an interrupted 2-0 Surgidac suture.  Enterotomies were made in the gastric pouch and in the small bowel and the Echelon blue load stapler was inserted to 28 mm and clamped.  This was then fired and the ensuing defect was closed using a layer of 2-0 Polysorb suture times one.  A 34 French blunt-tipped bougie was inserted trans-orally down past the anastomosis and the second layer of closure occurred with two running 2-0 Polysorb sutures from either side, creating a two-layer anastomosis.  The bougie was removed and the proximal Roux limb was clamped.  This was submerged underneath saline and endoscope was placed trans-orally and guided all  the way down to the anastomosis without difficulty.  The anastomosis was widely patent and hemostasis was excellent.  No leak was present under high-pressure insufflation with the proximal Roux limb clamp.  The endoscopes were removed and I re-gowned and re-gloved.  We took the omentum and draped that over the top of the anastomosis and sutured it in place using a 2-0 Surgidac suture.  The  Peterson's defect was then closed using running 2-0 Surgidac suture. A drain was placed in the left upper quadrant over the anastomosis of [place].  The liver retractor and trocars were removed without incident.  No bleeding occurred from any of these sites.  The wounds were then closed using 4-0 Vicryl and Dermabond.  The patient tolerated the procedure well, arrived in the recovery room in stable condition, and will be admitted to my care.  COMPLICATIONS: None.  ESTIMATED BLOOD LOSS: None.  ____________________________ Kreg Shropshire, MD jb:aw D: 10/19/2012 09:18:58 ET T: 10/19/2012 10:03:23 ET JOB#: [id number]  cc: Kreg Shropshire, MD, <Dictator> Wille Glaser Langley Adie MD ELECTRONICALLY SIGNED 10/20/2012 6:50

## 2014-07-24 ENCOUNTER — Telehealth: Payer: Self-pay

## 2014-07-24 NOTE — Telephone Encounter (Signed)
Pt states that her grandmother just passed away, and she would like to know if Dr. Leward Quan can refill her ALPRAZolam Jill Ortiz) 0.5 MG tablet [[id number]]

## 2014-07-25 MED ORDER — ALPRAZOLAM 0.5 MG PO TABS: 0.5000 mg | ORAL_TABLET | Freq: Two times a day (BID) | ORAL | Status: DC | PRN

## 2014-07-25 NOTE — Telephone Encounter (Signed)
Medication phoned to pt's pharmacy.

## 2014-07-26 NOTE — Telephone Encounter (Signed)
Pt.notified

## 2014-09-13 ENCOUNTER — Encounter: Payer: Self-pay | Admitting: Family Medicine

## 2014-09-19 MED ORDER — PHENTERMINE HCL 15 MG PO CAPS: 15.0000 mg | ORAL_CAPSULE | ORAL | Status: DC

## 2014-11-04 ENCOUNTER — Ambulatory Visit (INDEPENDENT_AMBULATORY_CARE_PROVIDER_SITE_OTHER): Payer: 59 | Admitting: Family Medicine

## 2014-11-04 ENCOUNTER — Encounter: Payer: Self-pay | Admitting: Family Medicine

## 2014-11-04 VITALS — BP 124/80 | HR 88 | Temp 98.1°F | Resp 16 | Ht 66.5 in | Wt 229.6 lb

## 2014-11-04 DIAGNOSIS — Z6836 Body mass index (BMI) 36.0-36.9, adult: Secondary | ICD-10-CM

## 2014-11-04 DIAGNOSIS — G4700 Insomnia, unspecified: Secondary | ICD-10-CM | POA: Diagnosis not present

## 2014-11-04 DIAGNOSIS — E669 Obesity, unspecified: Secondary | ICD-10-CM | POA: Diagnosis not present

## 2014-11-04 DIAGNOSIS — Z9884 Bariatric surgery status: Secondary | ICD-10-CM | POA: Diagnosis not present

## 2014-11-04 DIAGNOSIS — F4323 Adjustment disorder with mixed anxiety and depressed mood: Secondary | ICD-10-CM

## 2014-11-04 MED ORDER — PHENTERMINE HCL 30 MG PO TBDP: 30.0000 mg | ORAL_TABLET | Freq: Every day | ORAL | Status: DC

## 2014-11-04 MED ORDER — ALPRAZOLAM 0.5 MG PO TABS: 0.5000 mg | ORAL_TABLET | Freq: Every evening | ORAL | Status: DC | PRN

## 2014-11-04 NOTE — Progress Notes (Signed)
Subjective:    Patient ID: Jill Ortiz, female    DOB: 1990/08/23, 24 y.o.   MRN: [id number]  11/04/2014  Follow-up; Anxiety; weight loss; Thyroid; and Medication Refill   HPI This 24 y.o. female presents for one year follow-up:   1. Obesity: s/p bariatric surgery. Gained 15 pounds with new relationship.  Started Phentermine 09/24/14.  Started at 240.  Also doing weight watchers.  No side effects.  No skipping meals. B: protein shake L:  Drug rep meal; no dessert or breads; drink 2 diet Mt. Dews S:  No desserts; was doing a lot of sweets.  Meat, chicken, deer, vegetables.    Exercising; has increased running 2 miles four days per week.  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     Dating same boyfriend for 1.5 years.  Previous boyfriend got into drugs.  Living together; looking at rings.  Boyfriend bought a house last 11/2013; father moved to [place].  Did not move with parents. Moved in December 2015.  Going well.  Madly in love.  Gets along with family.    Insomnia: takes Xanax qhs when boyfriend on night shift.  Grandmother died in 10-16-22.  EPIC implementation was very stressful.  Dr. Rosanna Randy is still trying to get into swing of things.  Backed off of schedule end of July.  Now full schedule.    Mirena IUD placed two years ago.  [facility]/Melanie Burr.  Pap smear recently.  Gardisil series in past age 10.   Not sure if had FLP; s/p vitamins.  Now part of EP[employer].    Review of Systems  Constitutional: Negative for fever, chills, diaphoresis and fatigue.  Eyes: Negative for visual disturbance.  Respiratory: Negative for cough and shortness of breath.   Cardiovascular: Negative for chest pain, palpitations and leg swelling.  Gastrointestinal: Negative for nausea, vomiting, abdominal pain, diarrhea and constipation.  Endocrine: Negative for cold intolerance, heat intolerance, polydipsia, polyphagia and polyuria.  Neurological: Negative for  dizziness, tremors, seizures, syncope, facial asymmetry, speech difficulty, weakness, light-headedness, numbness and headaches.  Psychiatric/Behavioral: Positive for sleep disturbance. Negative for suicidal ideas, self-injury and dysphoric mood. The patient is nervous/anxious.     Past Medical History  Diagnosis Date  . Seasonal allergies   . Anxiety   . Glucose intolerance (impaired glucose tolerance)    Past Surgical History  Procedure Laterality Date  . Tonsillectomy and adenoidectomy    . Gastric bypass  10/19/2012    [facility].  . Polysomnography  08/27/2012    no OSA.  Pre-operative clearance for gastric bypass.  . Endocrinology consultation  09/26/2012    TSH slightly elevated, elevated cortisol.  Repeat labs normal.  Elevated cortisol due to OCPs.  No treatment warranted.   No Known Allergies Current Outpatient Prescriptions  Medication Sig Dispense Refill  . b complex vitamins tablet Take 1 tablet by mouth daily.    Marland Kitchen BIOTIN PO Take 500 mcg by mouth daily.    . Calcium-Vitamin D-Vitamin K 643-329-51 MG-UNT-MCG CHEW Chew by mouth 2 (two) times daily.    . Cyanocobalamin (VITAMIN B12 PO) Take 2,500 mcg by mouth daily.    Marland Kitchen docusate sodium (COLACE) 100 MG capsule Take 100 mg by mouth daily.    Marland Kitchen levonorgestrel (MIRENA) 20 MCG/24HR IUD 1 each by Intrauterine route once.    . Multiple Vitamin (MULTIVITAMIN) tablet Take 1 tablet by mouth daily.    Marland Kitchen ALPRAZolam (XANAX) 0.5 MG tablet Take 1 tablet (  0.5 mg total) by mouth at bedtime as needed for sleep. 30 tablet 3  . Phentermine HCl 30 MG TBDP Take 30 mg by mouth daily. 30 tablet 2   No current facility-administered medications for this visit.   Social History   Social History  . Marital Status: Single    Spouse Name: N/A  . Number of Children: N/A  . Years of Education: N/A   Occupational History  . [employer]   Social History Main Topics  . Smoking status: Never Smoker   . Smokeless tobacco: Not on file  . Alcohol Use:  Yes  . Drug Use: No  . Sexual Activity: Yes    Birth Control/ Protection: IUD   Other Topics Concern  . Not on file   Social History Narrative   Marital status: dating seriously x 2 years.      Children; none      Lives: with dad, boyfriend.      Employment: [employer] x 2 years; happy.  Works with [employer]       Tobacco: none      Alcohol: socially      Drugs; none      Exercise:  1-2 times per week.   Family History  Problem Relation Age of Onset  . Diabetes Mother   . Hyperlipidemia Mother   . Hypertension Mother   . COPD Mother   . Heart disease Mother     heart arrhythmia; V. Tach.  CHF  . Diabetes Maternal Grandmother   . Diabetes Paternal Grandmother   . Hyperlipidemia Father   .          Objective:    BP 124/80 mmHg  Pulse 88  Temp(Src) 98.1 F (36.7 C) (Oral)  Resp 16  Ht 5' 6.5" (1.689 m)  Wt 229 lb 9.6 oz (104.146 kg)  BMI 36.51 kg/m2  SpO2 98% Physical Exam  Constitutional: She is oriented to person, place, and time. She appears well-developed and well-nourished. No distress.  HENT:  Head: Normocephalic and atraumatic.  Right Ear: External ear normal.  Left Ear: External ear normal.  Nose: Nose normal.  Mouth/Throat: Oropharynx is clear and moist.  Eyes: Conjunctivae and EOM are normal. Pupils are equal, round, and reactive to light.  Neck: Normal range of motion. Neck supple. Carotid bruit is not present. No thyromegaly present.  Cardiovascular: Normal rate, regular rhythm, normal heart sounds and intact distal pulses.  Exam reveals no gallop and no friction rub.   No murmur heard. Pulmonary/Chest: Effort normal and breath sounds normal. She has no wheezes. She has no rales.  Abdominal: Soft. Bowel sounds are normal. She exhibits no distension and no mass. There is no tenderness. There is no rebound and no guarding.  Lymphadenopathy:    She has no cervical adenopathy.  Neurological: She is alert and oriented to person, place, and time. No cranial nerve  deficit.  Skin: Skin is warm and dry. No rash noted. She is not diaphoretic. No erythema. No pallor.  Psychiatric: She has a normal mood and affect. Her behavior is normal.        Assessment & Plan:   1. Adjustment disorder with mixed anxiety and depressed mood   2. Insomnia   3. Obesity   4. BMI 36.0-36.9,adult   5. S/P gastric bypass     1. Anxiety: improved; now using Xanax PRN when boyfriend working night shift; refill provided.  Avoid daily use; pt expressed understanding. 2.  Insomnia: stable; refill of  Xanax provided. 3.  Obesity/BMI 36: s/p gastric bypass with recent weight gain; agree to three month trial of Phentermine 30mg  daily.  Rx for three months provided.  Encourage daily exercise and low caloric food choices. 4. S/p gastric bypass: obtain labs from [facility].  Meds ordered this encounter  Medications  . DISCONTD: Phentermine HCl 30 MG TBDP    Sig: Take 30 mg by mouth daily.    Dispense:  30 tablet    Refill:  2  . Phentermine HCl 30 MG TBDP    Sig: Take 30 mg by mouth daily.    Dispense:  30 tablet    Refill:  2  . ALPRAZolam (XANAX) 0.5 MG tablet    Sig: Take 1 tablet (0.5 mg total) by mouth at bedtime as needed for sleep.    Dispense:  30 tablet    Refill:  3    Return in about 3 months (around 02/04/2015) for recheck.    Kynzli Rease Elayne Guerin, M.D. [facility] [address] [place], [place]  [address] [phone] phone [phone] fax

## 2015-01-28 ENCOUNTER — Encounter: Payer: Self-pay | Admitting: Family Medicine

## 2015-01-30 MED ORDER — PHENTERMINE HCL 37.5 MG PO CAPS: 37.5000 mg | ORAL_CAPSULE | ORAL | Status: DC

## 2015-01-30 NOTE — Telephone Encounter (Signed)
Please call in Phentermine to pharmacy as approved.

## 2015-01-31 NOTE — Telephone Encounter (Signed)
Faxed

## 2015-02-28 ENCOUNTER — Ambulatory Visit (INDEPENDENT_AMBULATORY_CARE_PROVIDER_SITE_OTHER): Payer: 59 | Admitting: Family Medicine

## 2015-02-28 ENCOUNTER — Encounter: Payer: Self-pay | Admitting: Family Medicine

## 2015-02-28 VITALS — BP 117/80 | HR 84 | Temp 97.9°F | Resp 16 | Wt 210.0 lb

## 2015-02-28 DIAGNOSIS — G4700 Insomnia, unspecified: Secondary | ICD-10-CM | POA: Diagnosis not present

## 2015-02-28 DIAGNOSIS — E669 Obesity, unspecified: Secondary | ICD-10-CM

## 2015-02-28 DIAGNOSIS — F411 Generalized anxiety disorder: Secondary | ICD-10-CM

## 2015-02-28 MED ORDER — ALPRAZOLAM 0.5 MG PO TABS: 0.5000 mg | ORAL_TABLET | Freq: Every evening | ORAL | Status: DC | PRN

## 2015-02-28 NOTE — Progress Notes (Signed)
Subjective:    Patient ID: Jill Ortiz, female    DOB: 01-30-1991, 24 y.o.   MRN: [id number]  02/28/2015  follow up weight loss   HPI This 24 y.o. female presents for evaluation of obesity. Weight down 31 pounds since June 2016.  At home, 208 without clothes.  Getting married January 17, 2015; engagement party tomorrow night.  Rustic barn with sunflower.  Exercising 4 days per week; 2.5 miles running.  [employer] in [place]; gets up at 4:45am to get to gym by 5:15.  Goes to school 6:00-9:00am before going to school.   Goal weight 180 pounds. B:  Protein shake. Snack: string cheese, bananas, diet Mt. Dew Lunch: drug rep foods, little plates; with weight gain was grazing. Snack: popcorn lightly salted air popped Supper: chicken, vegetables, diet Mt. Dew Snack: fruit or popcorn.   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  Grandfather with sepsis, CHF and hospitalized.  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     Review of Systems  Constitutional: Negative for fever, chills, diaphoresis and fatigue.  Eyes: Negative for visual disturbance.  Respiratory: Negative for cough and shortness of breath.   Cardiovascular: Negative for chest pain, palpitations and leg swelling.  Gastrointestinal: Negative for nausea, vomiting, abdominal pain, diarrhea and constipation.  Endocrine: Negative for cold intolerance, heat intolerance, polydipsia, polyphagia and polyuria.  Neurological: Negative for dizziness, tremors, seizures, syncope, facial asymmetry, speech difficulty, weakness, light-headedness, numbness and headaches.    Past Medical History  Diagnosis Date  . Seasonal allergies   . Anxiety   . Glucose intolerance (impaired glucose tolerance)    Past Surgical History  Procedure Laterality Date  . Tonsillectomy and adenoidectomy    . Gastric bypass  10/19/2012    [facility].  . Polysomnography  08/27/2012    no OSA.  Pre-operative clearance for  gastric bypass.  . Endocrinology consultation  09/26/2012    TSH slightly elevated, elevated cortisol.  Repeat labs normal.  Elevated cortisol due to OCPs.  No treatment warranted.   Not on File Current Outpatient Prescriptions  Medication Sig Dispense Refill  . ALPRAZolam (XANAX) 0.5 MG tablet Take 1 tablet (0.5 mg total) by mouth at bedtime as needed for sleep. 30 tablet 3  . b complex vitamins tablet Take 1 tablet by mouth daily.    Marland Kitchen BIOTIN PO Take 500 mcg by mouth daily.    . Calcium-Vitamin D-Vitamin K 163-845-36 MG-UNT-MCG CHEW Chew by mouth 2 (two) times daily.    . Cyanocobalamin (VITAMIN B12 PO) Take 2,500 mcg by mouth daily.    Marland Kitchen docusate sodium (COLACE) 100 MG capsule Take 100 mg by mouth daily.    Marland Kitchen levonorgestrel (MIRENA) 20 MCG/24HR IUD 1 each by Intrauterine route once.    . Multiple Vitamin (MULTIVITAMIN) tablet Take 1 tablet by mouth daily.    . phentermine 37.5 MG capsule Take 1 capsule (37.5 mg total) by mouth every morning. 30 capsule 2   No current facility-administered medications for this visit.   Social History   Social History  . Marital Status: Single    Spouse Name: N/A  . Number of Children: N/A  . Years of Education: N/A   Occupational History  . [employer]   Social History Main Topics  . Smoking status: Never Smoker   . Smokeless tobacco: Not on file  . Alcohol Use: Yes  . Drug Use: No  . Sexual Activity: Yes  Birth Control/ Protection: IUD   Other Topics Concern  . Not on file   Social History Narrative   Marital status: dating seriously x 2 years.      Children; none      Lives: with dad, boyfriend.      Employment: [employer] x 2 years; happy.  Works with [employer]       Tobacco: none      Alcohol: socially      Drugs; none      Exercise:  1-2 times per week.   Family History  Problem Relation Age of Onset  . Diabetes Mother   . Hyperlipidemia Mother   . Hypertension Mother   . COPD Mother   . Heart disease Mother     heart  arrhythmia; V. Tach.  CHF  . Diabetes Maternal Grandmother   . Diabetes Paternal Grandmother   . Hyperlipidemia Father   .          Objective:    BP 117/80 mmHg  Pulse 84  Temp(Src) 97.9 F (36.6 C) (Oral)  Resp 16  Wt 210 lb (95.255 kg) Physical Exam  Constitutional: She is oriented to person, place, and time. She appears well-developed and well-nourished. No distress.  HENT:  Head: Normocephalic and atraumatic.  Right Ear: External ear normal.  Left Ear: External ear normal.  Nose: Nose normal.  Mouth/Throat: Oropharynx is clear and moist.  Eyes: Conjunctivae and EOM are normal. Pupils are equal, round, and reactive to light.  Neck: Normal range of motion. Neck supple. Carotid bruit is not present. No thyromegaly present.  Cardiovascular: Normal rate, regular rhythm, normal heart sounds and intact distal pulses.  Exam reveals no gallop and no friction rub.   No murmur heard. Pulmonary/Chest: Effort normal and breath sounds normal. She has no wheezes. She has no rales.  Abdominal: Soft. Bowel sounds are normal. She exhibits no distension and no mass. There is no tenderness. There is no rebound and no guarding.  Lymphadenopathy:    She has no cervical adenopathy.  Neurological: She is alert and oriented to person, place, and time. No cranial nerve deficit.  Skin: Skin is warm and dry. No rash noted. She is not diaphoretic. No erythema. No pallor.  Psychiatric: She has a normal mood and affect. Her behavior is normal.   Results for orders placed or performed in visit on 08/06/13  GC/Chlamydia Probe Amp  Result Value Ref Range   CT Probe RNA NEGATIVE    GC Probe RNA NEGATIVE   Hemoglobin A1c  Result Value Ref Range   Hgb A1c MFr Bld 5.4 <5.7 %   Mean Plasma Glucose 108 <117 mg/dL  RPR  Result Value Ref Range   RPR Ser Ql NON REAC NON REAC  HIV antibody  Result Value Ref Range   HIV 1&2 Ab, 4th Generation NONREACTIVE NONREACTIVE  CBC with Differential  Result Value  Ref Range   WBC 6.4 4.0 - 10.5 K/uL   RBC 4.70 3.87 - 5.11 MIL/uL   Hemoglobin 14.2 12.0 - 15.0 g/dL   HCT 41.7 36.0 - 46.0 %   MCV 88.7 78.0 - 100.0 fL   MCH 30.2 26.0 - 34.0 pg   MCHC 34.1 30.0 - 36.0 g/dL   RDW 13.6 11.5 - 15.5 %   Platelets 262 150 - 400 K/uL   Neutrophils Relative % 61 43 - 77 %   Neutro Abs 3.9 1.7 - 7.7 K/uL   Lymphocytes Relative 32 12 - 46 %  Lymphs Abs 2.0 0.7 - 4.0 K/uL   Monocytes Relative 6 3 - 12 %   Monocytes Absolute 0.4 0.1 - 1.0 K/uL   Eosinophils Relative 1 0 - 5 %   Eosinophils Absolute 0.1 0.0 - 0.7 K/uL   Basophils Relative 0 0 - 1 %   Basophils Absolute 0.0 0.0 - 0.1 K/uL   Smear Review Criteria for review not met   COMPLETE METABOLIC PANEL WITH GFR  Result Value Ref Range   Sodium 139 135 - 145 mEq/L   Potassium 4.2 3.5 - 5.3 mEq/L   Chloride 103 96 - 112 mEq/L   CO2 27 19 - 32 mEq/L   Glucose, Bld 89 70 - 99 mg/dL   BUN 10 6 - 23 mg/dL   Creat 0.91 0.50 - 1.10 mg/dL   Total Bilirubin 0.5 0.2 - 1.2 mg/dL   Alkaline Phosphatase 97 39 - 117 U/L   AST 22 0 - 37 U/L   ALT 20 0 - 35 U/L   Total Protein 7.2 6.0 - 8.3 g/dL   Albumin 4.8 3.5 - 5.2 g/dL   Calcium 9.8 8.4 - 10.5 mg/dL   GFR, Est African American >89 mL/min   GFR, Est Non African American 88 mL/min  TSH  Result Value Ref Range   TSH 2.186 0.350 - 4.500 uIU/mL  Vitamin B12  Result Value Ref Range   Vitamin B-12 318 211 - 911 pg/mL  Folate  Result Value Ref Range   Folate >20.0 ng/mL  Iron  Result Value Ref Range   Iron 141 42 - 145 ug/dL  Vit D  25 hydroxy (rtn osteoporosis monitoring)  Result Value Ref Range   Vit D, 25-Hydroxy 49 30 - 89 ng/mL  POCT Wet Prep with KOH  Result Value Ref Range   Trichomonas, UA Negative    Clue Cells Wet Prep HPF POC 7-15    Epithelial Wet Prep HPF POC 7-15    Yeast Wet Prep HPF POC neg    Bacteria Wet Prep HPF POC 3+    RBC Wet Prep HPF POC 12-16    WBC Wet Prep HPF POC tntc    KOH Prep POC Negative        Assessment &  Plan:   1. Anxiety state   2. Obesity   3. Insomnia     No orders of the defined types were placed in this encounter.   Meds ordered this encounter  Medications  . ALPRAZolam (XANAX) 0.5 MG tablet    Sig: Take 1 tablet (0.5 mg total) by mouth at bedtime as needed for sleep.    Dispense:  30 tablet    Refill:  3    Return in about 3 months (around 05/29/2015) for recheck.   Jacki Couse Elayne Guerin, M.D. [facility] [address] [place], [place]  [address] [phone] phone [phone] fax

## 2015-04-25 ENCOUNTER — Ambulatory Visit: Payer: Self-pay | Admitting: Physician Assistant

## 2015-04-25 ENCOUNTER — Encounter: Payer: Self-pay | Admitting: Physician Assistant

## 2015-04-25 VITALS — BP 126/90 | HR 82 | Temp 97.7°F

## 2015-04-25 DIAGNOSIS — R1013 Epigastric pain: Secondary | ICD-10-CM

## 2015-04-25 NOTE — Progress Notes (Signed)
S: c/o epigastric type pain, no v/, had 2 days of diarrhea, some uri sx and fever at same time, no cp/sob/bloody diarrhea, no uti sx; used gas x and nexium with a little relief, concerned as had gastric bypass 3 years ago  O: vitals wnl, nad, lungs c t a, cv rrr, abd soft minimally tender in epigastric area, bs normal all 4 quads  A: epigastric pain  P: reassurance, continue nexium, eat bland diet for few days, if worsening return to clinic/can refer to GI if needed

## 2015-05-05 ENCOUNTER — Encounter: Payer: Self-pay | Admitting: Family Medicine

## 2015-05-07 MED ORDER — PHENTERMINE HCL 37.5 MG PO CAPS
37.5000 mg | ORAL_CAPSULE | ORAL | Status: DC
Start: 2015-05-07 — End: 2015-06-10

## 2015-05-07 NOTE — Telephone Encounter (Signed)
Please call in Phentermine as approved.

## 2015-05-08 NOTE — Telephone Encounter (Signed)
Called in.

## 2015-05-30 ENCOUNTER — Ambulatory Visit: Payer: 59 | Admitting: Family Medicine

## 2015-06-02 ENCOUNTER — Encounter: Payer: Self-pay | Admitting: *Deleted

## 2015-06-04 ENCOUNTER — Encounter: Payer: Self-pay | Admitting: Family Medicine

## 2015-06-06 ENCOUNTER — Ambulatory Visit (INDEPENDENT_AMBULATORY_CARE_PROVIDER_SITE_OTHER): Payer: 59 | Admitting: Obstetrics and Gynecology

## 2015-06-06 ENCOUNTER — Encounter: Payer: Self-pay | Admitting: Obstetrics and Gynecology

## 2015-06-06 VITALS — BP 116/84 | HR 95 | Ht 67.0 in | Wt 204.6 lb

## 2015-06-06 DIAGNOSIS — Z803 Family history of malignant neoplasm of breast: Secondary | ICD-10-CM | POA: Diagnosis not present

## 2015-06-06 DIAGNOSIS — Z01419 Encounter for gynecological examination (general) (routine) without abnormal findings: Secondary | ICD-10-CM

## 2015-06-06 NOTE — Patient Instructions (Signed)
Place annual gynecologic exam patient instructions here.

## 2015-06-06 NOTE — Progress Notes (Signed)
  Subjective:     Jill Ortiz is a 25 y.o. female and is here for a comprehensive physical exam. The patient reports no problems.  Social History   Social History  . Marital Status: Single    Spouse Name: N/A  . Number of Children: N/A  . Years of Education: N/A   Occupational History  . [employer]   Social History Main Topics  . Smoking status: Never Smoker   . Smokeless tobacco: Not on file  . Alcohol Use: Yes  . Drug Use: No  . Sexual Activity: Yes    Birth Control/ Protection: IUD   Other Topics Concern  . Not on file   Social History Narrative   Marital status: dating seriously x 2 years.      Children; none      Lives: with dad, boyfriend.      Employment: [employer] x 2 years; happy.  Works with [employer]       Tobacco: none      Alcohol: socially      Drugs; none      Exercise:  1-2 times per week.   Health Maintenance  Topic Date Due  . INFLUENZA VACCINE  10/28/2014  . PAP SMEAR  05/30/2017  . TETANUS/TDAP  03/29/2018  . HIV Screening  Completed    The following portions of the patient's history were reviewed and updated as appropriate: allergies, current medications, past family history, past medical history, past social history, past surgical history and problem list.  Review of Systems A comprehensive review of systems was negative.   Objective:    General appearance: alert, cooperative and appears stated age Neck: no adenopathy, no carotid bruit, no JVD, supple, symmetrical, trachea midline and thyroid not enlarged, symmetric, no tenderness/mass/nodules Lungs: clear to auscultation bilaterally Breasts: normal appearance, no masses or tenderness Abdomen: soft, non-tender; bowel sounds normal; no masses,  no organomegaly Pelvic: cervix normal in appearance, external genitalia normal, no adnexal masses or tenderness, no cervical motion tenderness, rectovaginal septum normal, uterus normal size, shape, and consistency, vagina normal without discharge  and IUD string noted Extremities: extremities normal, atraumatic, no cyanosis or edema    Assessment:    Healthy female exam. Overweight, IUD survellience; S/P gastric bypass      Plan:  Labs obtained.   See After Visit Summary for Counseling Recommendations

## 2015-06-07 LAB — CBC
Hematocrit: 33.5 % — ABNORMAL LOW (ref 34.0–46.6)
Hemoglobin: 10.7 g/dL — ABNORMAL LOW (ref 11.1–15.9)
MCH: 25.8 pg — AB (ref 26.6–33.0)
MCHC: 31.9 g/dL (ref 31.5–35.7)
MCV: 81 fL (ref 79–97)
PLATELETS: 348 10*3/uL (ref 150–379)
RBC: 4.14 x10E6/uL (ref 3.77–5.28)
RDW: 15.5 % — ABNORMAL HIGH (ref 12.3–15.4)
WBC: 4.9 10*3/uL (ref 3.4–10.8)

## 2015-06-07 LAB — THYROID PANEL WITH TSH
FREE THYROXINE INDEX: 1.9 (ref 1.2–4.9)
T3 Uptake Ratio: 24 % (ref 24–39)
T4, Total: 7.8 ug/dL (ref 4.5–12.0)
TSH: 3.09 u[IU]/mL (ref 0.450–4.500)

## 2015-06-07 LAB — COMPREHENSIVE METABOLIC PANEL
ALK PHOS: 72 IU/L (ref 39–117)
ALT: 8 IU/L (ref 0–32)
AST: 17 IU/L (ref 0–40)
Albumin/Globulin Ratio: 1.9 (ref 1.1–2.5)
Albumin: 4.4 g/dL (ref 3.5–5.5)
BUN/Creatinine Ratio: 12 (ref 8–20)
BUN: 10 mg/dL (ref 6–20)
Bilirubin Total: 0.4 mg/dL (ref 0.0–1.2)
CO2: 21 mmol/L (ref 18–29)
CREATININE: 0.84 mg/dL (ref 0.57–1.00)
Calcium: 9.1 mg/dL (ref 8.7–10.2)
Chloride: 102 mmol/L (ref 96–106)
GFR calc Af Amer: 110 mL/min/{1.73_m2} (ref >59)
GFR calc non Af Amer: 96 mL/min/{1.73_m2} (ref >59)
GLUCOSE: 82 mg/dL (ref 65–99)
Globulin, Total: 2.3 g/dL (ref 1.5–4.5)
Potassium: 4 mmol/L (ref 3.5–5.2)
Sodium: 139 mmol/L (ref 134–144)
Total Protein: 6.7 g/dL (ref 6.0–8.5)

## 2015-06-07 LAB — VITAMIN D 25 HYDROXY (VIT D DEFICIENCY, FRACTURES): Vit D, 25-Hydroxy: 46.5 ng/mL (ref 30.0–100.0)

## 2015-06-07 LAB — LIPID PANEL
Chol/HDL Ratio: 2.8 ratio units (ref 0.0–4.4)
Cholesterol, Total: 121 mg/dL (ref 100–199)
HDL: 44 mg/dL (ref >39)
LDL CALC: 62 mg/dL (ref 0–99)
Triglycerides: 73 mg/dL (ref 0–149)
VLDL CHOLESTEROL CAL: 15 mg/dL (ref 5–40)

## 2015-06-07 LAB — VITAMIN B12: VITAMIN B 12: 375 pg/mL (ref 211–946)

## 2015-06-07 LAB — HEMOGLOBIN A1C
ESTIMATED AVERAGE GLUCOSE: 103 mg/dL
HEMOGLOBIN A1C: 5.2 % (ref 4.8–5.6)

## 2015-06-07 LAB — IRON: IRON: 39 ug/dL (ref 27–159)

## 2015-06-10 ENCOUNTER — Encounter: Payer: Self-pay | Admitting: Obstetrics and Gynecology

## 2015-06-10 ENCOUNTER — Encounter: Payer: Self-pay | Admitting: Family Medicine

## 2015-06-10 ENCOUNTER — Ambulatory Visit (INDEPENDENT_AMBULATORY_CARE_PROVIDER_SITE_OTHER): Payer: 59 | Admitting: Family Medicine

## 2015-06-10 VITALS — BP 120/77 | HR 89 | Temp 98.5°F | Resp 16 | Ht 66.0 in | Wt 204.0 lb

## 2015-06-10 DIAGNOSIS — E669 Obesity, unspecified: Secondary | ICD-10-CM | POA: Diagnosis not present

## 2015-06-10 DIAGNOSIS — J301 Allergic rhinitis due to pollen: Secondary | ICD-10-CM

## 2015-06-10 DIAGNOSIS — D509 Iron deficiency anemia, unspecified: Secondary | ICD-10-CM

## 2015-06-10 DIAGNOSIS — R55 Syncope and collapse: Secondary | ICD-10-CM

## 2015-06-10 MED ORDER — PHENTERMINE HCL 37.5 MG PO CAPS: 37.5000 mg | ORAL_CAPSULE | ORAL | Status: DC

## 2015-06-10 MED ORDER — FLUTICASONE PROPIONATE 50 MCG/ACT NA SUSP: 2.0000 | Freq: Every day | NASAL | Status: DC

## 2015-06-10 NOTE — Progress Notes (Signed)
Subjective:    Patient ID: Jill Ortiz, female    DOB: 08/20/1990, 25 y.o.   MRN: [id number]  06/10/2015  Follow-up and Medication Refill   HPI This 25 y.o. female presents for follow-up:  1.  Anemia:  Had donated blood two weeks prior to visit; was also on menses.  Walked to blood draw and back.    2. Near syncope event: occurred after blood work last week; Dr. Venia Minks felt due to vasovagal event. Had fatigue afterwards.  Did not feel right for two weeks afterwards.  No fever/chills/sweats.  Fine now.  Did work out that morning as well.    3.  Obesity: working out five days per week; ran a 5K last week.  Total weight loss 40 pounds since starting Phentermine.  No side effects to Phentermine; not skipping meals.  Eating too much.  Able to eat more now since exercising five days per week.  Last 4-5 weeks 3 miles running.  Treadmill.     Review of Systems  Constitutional: Negative for fever, chills, diaphoresis and fatigue.  Eyes: Negative for visual disturbance.  Respiratory: Negative for cough and shortness of breath.   Cardiovascular: Negative for chest pain, palpitations and leg swelling.  Gastrointestinal: Negative for nausea, vomiting, abdominal pain, diarrhea and constipation.  Endocrine: Negative for cold intolerance, heat intolerance, polydipsia, polyphagia and polyuria.  Neurological: Negative for dizziness, tremors, seizures, syncope, facial asymmetry, speech difficulty, weakness, light-headedness, numbness and headaches.    Past Medical History  Diagnosis Date  . Seasonal allergies   . Anxiety   . Glucose intolerance (impaired glucose tolerance)    Past Surgical History  Procedure Laterality Date  . Tonsillectomy and adenoidectomy    . Gastric bypass  10/19/2012    [facility].  . Polysomnography  08/27/2012    no OSA.  Pre-operative clearance for gastric bypass.  . Endocrinology consultation  09/26/2012    TSH slightly elevated, elevated cortisol.  Repeat labs normal.   Elevated cortisol due to OCPs.  No treatment warranted.   No Known Allergies Current Outpatient Prescriptions  Medication Sig Dispense Refill  . ALPRAZolam (XANAX) 0.5 MG tablet Take 1 tablet (0.5 mg total) by mouth at bedtime as needed for sleep. 30 tablet 3  . b complex vitamins tablet Take 1 tablet by mouth daily.    Marland Kitchen BIOTIN PO Take 500 mcg by mouth daily.    . Calcium-Vitamin D-Vitamin K [id number] MG-UNT-MCG CHEW Chew by mouth 2 (two) times daily.    . Cyanocobalamin (VITAMIN B12 PO) Take 2,500 mcg by mouth daily.    Marland Kitchen docusate sodium (COLACE) 100 MG capsule Take 100 mg by mouth daily.    . Multiple Vitamin (MULTIVITAMIN) tablet Take 1 tablet by mouth daily.    . phentermine 37.5 MG capsule Take 1 capsule (37.5 mg total) by mouth every morning. 30 capsule 2  . fluticasone (FLONASE) 50 MCG/ACT nasal spray Place 2 sprays into both nostrils daily. 16 g 11  . levonorgestrel (MIRENA) 20 MCG/24HR IUD 1 each by Intrauterine route once.     No current facility-administered medications for this visit.   Social History   Social History  . Marital Status: Single    Spouse Name: N/A  . Number of Children: N/A  . Years of Education: N/A   Occupational History  . [employer]   Social History Main Topics  . Smoking status: Never Smoker   . Smokeless tobacco: Not on file  . Alcohol Use: Yes  .  Drug Use: No  . Sexual Activity: Yes    Birth Control/ Protection: IUD   Other Topics Concern  . Not on file   Social History Narrative   Marital status: dating seriously x 2 years.      Children; none      Lives: with dad, boyfriend.      Employment: [employer] x 2 years; happy.  Works with [employer]       Tobacco: none      Alcohol: socially      Drugs; none      Exercise:  1-2 times per week.   Family History  Problem Relation Age of Onset  . Diabetes Mother   . Hyperlipidemia Mother   . Hypertension Mother   . COPD Mother   . Heart disease Mother     heart arrhythmia; V. Tach.  CHF    . Diabetes Maternal Grandmother   . Diabetes Paternal Grandmother   . Hyperlipidemia Father   .          Objective:    BP 120/77 mmHg  Pulse 89  Temp(Src) 98.5 F (36.9 C) (Oral)  Resp 16  Ht 5\' 6"  (1.676 m)  Wt 204 lb (92.534 kg)  BMI 32.94 kg/m2  SpO2 100% Physical Exam  Constitutional: She is oriented to person, place, and time. She appears well-developed and well-nourished. No distress.  HENT:  Head: Normocephalic and atraumatic.  Right Ear: External ear normal.  Left Ear: External ear normal.  Nose: Nose normal.  Mouth/Throat: Oropharynx is clear and moist.  Eyes: Conjunctivae and EOM are normal. Pupils are equal, round, and reactive to light.  Neck: Normal range of motion. Neck supple. Carotid bruit is not present. No thyromegaly present.  Cardiovascular: Normal rate, regular rhythm, normal heart sounds and intact distal pulses.  Exam reveals no gallop and no friction rub.   No murmur heard. Pulmonary/Chest: Effort normal and breath sounds normal. She has no wheezes. She has no rales.  Abdominal: Soft. Bowel sounds are normal. She exhibits no distension and no mass. There is no tenderness. There is no rebound and no guarding.  Lymphadenopathy:    She has no cervical adenopathy.  Neurological: She is alert and oriented to person, place, and time. No cranial nerve deficit.  Skin: Skin is warm and dry. No rash noted. She is not diaphoretic. No erythema. No pallor.  Psychiatric: She has a normal mood and affect. Her behavior is normal.   Results for orders placed or performed in visit on 06/06/15  CBC  Result Value Ref Range   WBC 4.9 3.4 - 10.8 x10E3/uL   RBC 4.14 3.77 - 5.28 x10E6/uL   Hemoglobin 10.7 (L) 11.1 - 15.9 g/dL   Hematocrit 33.5 (L) 34.0 - 46.6 %   MCV 81 79 - 97 fL   MCH 25.8 (L) 26.6 - 33.0 pg   MCHC 31.9 31.5 - 35.7 g/dL   RDW 15.5 (H) 12.3 - 15.4 %   Platelets 348 150 - 379 x10E3/uL  Thyroid Panel With TSH  Result Value Ref Range   TSH 3.090  0.450 - 4.500 uIU/mL   T4, Total 7.8 4.5 - 12.0 ug/dL   T3 Uptake Ratio 24 24 - 39 %   Free Thyroxine Index 1.9 1.2 - 4.9  Lipid panel  Result Value Ref Range   Cholesterol, Total 121 100 - 199 mg/dL   Triglycerides 73 0 - 149 mg/dL   HDL 44 >39 mg/dL   VLDL Cholesterol Cal 15  5 - 40 mg/dL   LDL Calculated 62 0 - 99 mg/dL   Chol/HDL Ratio 2.8 0.0 - 4.4 ratio units  Comprehensive metabolic panel  Result Value Ref Range   Glucose 82 65 - 99 mg/dL   BUN 10 6 - 20 mg/dL   Creatinine, Ser 0.84 0.57 - 1.00 mg/dL   GFR calc non Af Amer 96 >59 mL/min/1.73   GFR calc Af Amer 110 >59 mL/min/1.73   BUN/Creatinine Ratio 12 8 - 20   Sodium 139 134 - 144 mmol/L   Potassium 4.0 3.5 - 5.2 mmol/L   Chloride 102 96 - 106 mmol/L   CO2 21 18 - 29 mmol/L   Calcium 9.1 8.7 - 10.2 mg/dL   Total Protein 6.7 6.0 - 8.5 g/dL   Albumin 4.4 3.5 - 5.5 g/dL   Globulin, Total 2.3 1.5 - 4.5 g/dL   Albumin/Globulin Ratio 1.9 1.1 - 2.5   Bilirubin Total 0.4 0.0 - 1.2 mg/dL   Alkaline Phosphatase 72 39 - 117 IU/L   AST 17 0 - 40 IU/L   ALT 8 0 - 32 IU/L  Hemoglobin A1c  Result Value Ref Range   Hgb A1c MFr Bld 5.2 4.8 - 5.6 %   Est. average glucose Bld gHb Est-mCnc 103 mg/dL  Vitamin D (25 hydroxy)  Result Value Ref Range   Vit D, 25-Hydroxy 46.5 30.0 - 100.0 ng/mL  B12  Result Value Ref Range   Vitamin B-12 375 211 - 946 pg/mL  Iron  Result Value Ref Range   Iron 39 27 - 159 ug/dL       Assessment & Plan:   1. Near syncope   2. Seasonal allergic rhinitis due to pollen   3. Anemia, iron deficiency   4. Obesity     No orders of the defined types were placed in this encounter.   Meds ordered this encounter  Medications  . DISCONTD: fluticasone (FLONASE) 50 MCG/ACT nasal spray    Sig: Place 2 sprays into both nostrils daily.  . fluticasone (FLONASE) 50 MCG/ACT nasal spray    Sig: Place 2 sprays into both nostrils daily.    Dispense:  16 g    Refill:  11  . phentermine 37.5 MG capsule     Sig: Take 1 capsule (37.5 mg total) by mouth every morning.    Dispense:  30 capsule    Refill:  2    Return in about 3 months (around 09/10/2015) for recheck.    Anival Pasha Elayne Guerin, M.D. [facility] [address] [place], [place]  [address] [phone] phone [phone] fax

## 2015-06-10 NOTE — Patient Instructions (Signed)
     IF you received an x-ray today, you will receive an invoice from [facility]. Please contact [facility] at [phone] with questions or concerns regarding your invoice.   IF you received labwork today, you will receive an invoice from [facility]. Please contact [facility] at [phone] with questions or concerns regarding your invoice.   Our billing staff will not be able to assist you with questions regarding bills from these companies.  You will be contacted with the lab results as soon as they are available. The fastest way to get your results is to activate your My Chart account. Instructions are located on the last page of this paperwork. If you have not heard from us regarding the results in 2 weeks, please contact this office.      

## 2015-08-12 ENCOUNTER — Encounter: Payer: Self-pay | Admitting: Family Medicine

## 2015-08-18 ENCOUNTER — Telehealth: Payer: Self-pay

## 2015-08-18 MED ORDER — ALPRAZOLAM 0.5 MG PO TABS: 0.5000 mg | ORAL_TABLET | Freq: Every evening | ORAL | Status: DC | PRN

## 2015-08-18 NOTE — Telephone Encounter (Signed)
Patient sent an e-mail on the 16th requesting a refill for xanax. [place]

## 2015-08-18 NOTE — Telephone Encounter (Signed)
Please call in Xanax refill as approved. 

## 2015-08-19 NOTE — Telephone Encounter (Signed)
Called in Rx and notified pt on mychart.

## 2015-08-26 NOTE — Telephone Encounter (Signed)
I called this into pharm on 5/23 and notified pt by Mychart same day.

## 2015-10-21 ENCOUNTER — Ambulatory Visit: Payer: Self-pay | Admitting: Family Medicine

## 2015-10-28 ENCOUNTER — Encounter: Payer: Self-pay | Admitting: Family Medicine

## 2015-11-03 ENCOUNTER — Encounter: Payer: Self-pay | Admitting: Family Medicine

## 2015-11-04 ENCOUNTER — Telehealth: Payer: Self-pay

## 2015-11-04 NOTE — Telephone Encounter (Signed)
Patient is calling because she put in a prescription request on 8/1 forphentermine 37.5 mg and hasn't heard anything.

## 2015-11-05 ENCOUNTER — Telehealth: Payer: Self-pay | Admitting: Emergency Medicine

## 2015-11-05 MED ORDER — PHENTERMINE HCL 37.5 MG PO CAPS: 37.5000 mg | ORAL_CAPSULE | ORAL | 0 refills | Status: DC

## 2015-11-05 NOTE — Telephone Encounter (Signed)
Please call in refill of Phentermine.

## 2015-11-05 NOTE — Telephone Encounter (Signed)
Phentermine called in. Left message for pt to pick up

## 2015-12-02 ENCOUNTER — Encounter: Payer: Self-pay | Admitting: Family Medicine

## 2015-12-02 ENCOUNTER — Ambulatory Visit (INDEPENDENT_AMBULATORY_CARE_PROVIDER_SITE_OTHER): Payer: 59 | Admitting: Family Medicine

## 2015-12-02 VITALS — BP 110/78 | HR 95 | Temp 98.5°F | Resp 18 | Ht 66.0 in | Wt 204.0 lb

## 2015-12-02 DIAGNOSIS — D62 Acute posthemorrhagic anemia: Secondary | ICD-10-CM | POA: Diagnosis not present

## 2015-12-02 DIAGNOSIS — F411 Generalized anxiety disorder: Secondary | ICD-10-CM

## 2015-12-02 DIAGNOSIS — E669 Obesity, unspecified: Secondary | ICD-10-CM | POA: Diagnosis not present

## 2015-12-02 LAB — CBC WITH DIFFERENTIAL/PLATELET
BASOS PCT: 0 %
Basophils Absolute: 0 cells/uL (ref 0–200)
EOS ABS: 54 {cells}/uL (ref 15–500)
Eosinophils Relative: 1 %
HEMATOCRIT: 33.3 % — AB (ref 35.0–45.0)
HEMOGLOBIN: 10.2 g/dL — AB (ref 11.7–15.5)
LYMPHS ABS: 2106 {cells}/uL (ref 850–3900)
Lymphocytes Relative: 39 %
MCH: 23.2 pg — ABNORMAL LOW (ref 27.0–33.0)
MCHC: 30.6 g/dL — ABNORMAL LOW (ref 32.0–36.0)
MCV: 75.7 fL — AB (ref 80.0–100.0)
MONO ABS: 486 {cells}/uL (ref 200–950)
MPV: 9.2 fL (ref 7.5–12.5)
Monocytes Relative: 9 %
NEUTROS ABS: 2754 {cells}/uL (ref 1500–7800)
Neutrophils Relative %: 51 %
Platelets: 302 10*3/uL (ref 140–400)
RBC: 4.4 MIL/uL (ref 3.80–5.10)
RDW: 16.9 % — ABNORMAL HIGH (ref 11.0–15.0)
WBC: 5.4 10*3/uL (ref 3.8–10.8)

## 2015-12-02 LAB — IBC PANEL
%SAT: 3 % — AB (ref 11–50)
TIBC: 482 ug/dL — AB (ref 250–450)
UIBC: 467 ug/dL — AB (ref 125–400)

## 2015-12-02 LAB — IRON: Iron: 15 ug/dL — ABNORMAL LOW (ref 40–190)

## 2015-12-02 MED ORDER — PHENTERMINE HCL 37.5 MG PO CAPS: 37.5000 mg | ORAL_CAPSULE | ORAL | 5 refills | Status: DC

## 2015-12-02 MED ORDER — ALPRAZOLAM 0.5 MG PO TABS
0.5000 mg | ORAL_TABLET | Freq: Every evening | ORAL | 3 refills | Status: DC | PRN
Start: 2015-12-02 — End: 2016-06-29

## 2015-12-02 NOTE — Progress Notes (Signed)
Subjective:    Patient ID: Jill Ortiz, female    DOB: 1990-10-19, 25 y.o.   MRN: [id number]  12/02/2015  Follow-up (weight loss/anemia)   HPI This 25 y.o. female presents for follow-up  Started [place] program one day per month; cardio jogging but less; running 2 miles per day.  Gym; [employer] at 5:15am.  Continues to take Phentermine 37.74m daily; must maintain; going on a cruise  [employer] Pal[employer]is changing jobs; has more set hours.  Was on alternating shifts.  6:30am-2:30pm shift for fiance. Wedding planning; sister-in-law moved in with patient.   Wt Readings from Last 3 Encounters:  12/02/15 204 lb (92.5 kg)  06/10/15 204 lb (92.5 kg)  06/06/15 204 lb 9.6 oz (92.8 kg)   Review of Systems  Constitutional: Negative for chills, diaphoresis, fatigue and fever.  Eyes: Negative for visual disturbance.  Respiratory: Negative for cough and shortness of breath.   Cardiovascular: Negative for chest pain, palpitations and leg swelling.  Gastrointestinal: Negative for abdominal pain, constipation, diarrhea, nausea and vomiting.  Endocrine: Negative for cold intolerance, heat intolerance, polydipsia, polyphagia and polyuria.  Neurological: Negative for dizziness, tremors, seizures, syncope, facial asymmetry, speech difficulty, weakness, light-headedness, numbness and headaches.    Past Medical History:  Diagnosis Date  . Anxiety   . Glucose intolerance (impaired glucose tolerance)   . Seasonal allergies    Past Surgical History:  Procedure Laterality Date  . Endocrinology consultation  09/26/2012   TSH slightly elevated, elevated cortisol.  Repeat labs normal.  Elevated cortisol due to OCPs.  No treatment warranted.  .Mar[place] KitchenGASTRIC BYPASS  10/19/2012   [facility].  .Marland KitchenPOLYSOMNOGRAPHY  08/27/2012   no OSA.  Pre-operative clearance for gastric bypass.  . TONSILLECTOMY AND ADENOIDECTOMY     No Known Allergies  Social History   Social History  . Marital status: Single    Spouse name: N/A    . Number of children: N/A  . Years of education: N/A   Occupational History  . [employer]  Social History Main Topics  . Smoking status: Never Smoker  . Smokeless tobacco: Never Used  . Alcohol use Yes  . Drug use: No  . Sexual activity: Yes    Birth control/ protection: IUD   Other Topics Concern  . Not on file   Social History Narrative   Marital status: dating seriously x 2 years.      Children; none      Lives: with dad, boyfriend.      Employment: [employer] x 2 years; happy.  Works with [employer]      Tobacco: none      Alcohol: socially      Drugs; none      Exercise:  1-2 times per week.   Family History  Problem Relation Age of Onset  . Diabetes Mother   . Hyperlipidemia Mother   . Hypertension Mother   . COPD Mother   . Heart disease Mother     heart arrhythmia; V. Tach.  CHF  . Hyperlipidemia Father   . Diabetes Maternal Grandmother   . Diabetes Paternal Grandmother        Objective:    BP 110/78 (BP Location: Right Arm, Patient Position: Sitting, Cuff Size: Small)   Pulse 95   Temp 98.5 F (36.9 C) (Oral)   Resp 18   Ht '5\' 6"'  (1.676 m)   Wt 204 lb (92.5 kg)   SpO2 100%   BMI 32.93 kg/m  Physical Exam  Constitutional: She is oriented to person, place, and time. She appears well-developed and well-nourished. No distress.  HENT:  Head: Normocephalic and atraumatic.  Right Ear: External ear normal.  Left Ear: External ear normal.  Nose: Nose normal.  Mouth/Throat: Oropharynx is clear and moist.  Eyes: Conjunctivae and EOM are normal. Pupils are equal, round, and reactive to light.  Neck: Normal range of motion. Neck supple. Carotid bruit is not present. No thyromegaly present.  Cardiovascular: Normal rate, regular rhythm, normal heart sounds and intact distal pulses.  Exam reveals no gallop and no friction rub.   No murmur heard. Pulmonary/Chest: Effort normal and breath sounds normal. She has no wheezes. She has no rales.  Abdominal: Soft.  Bowel sounds are normal. She exhibits no distension and no mass. There is no tenderness. There is no rebound and no guarding.  Lymphadenopathy:    She has no cervical adenopathy.  Neurological: She is alert and oriented to person, place, and time. No cranial nerve deficit.  Skin: Skin is warm and dry. No rash noted. She is not diaphoretic. No erythema. No pallor.  Psychiatric: She has a normal mood and affect. Her behavior is normal.   Results for orders placed or performed in visit on 12/02/15  CBC with Differential/Platelet  Result Value Ref Range   WBC 5.4 3.8 - 10.8 K/uL   RBC 4.40 3.80 - 5.10 MIL/uL   Hemoglobin 10.2 (L) 11.7 - 15.5 g/dL   HCT 33.3 (L) 35.0 - 45.0 %   MCV 75.7 (L) 80.0 - 100.0 fL   MCH 23.2 (L) 27.0 - 33.0 pg   MCHC 30.6 (L) 32.0 - 36.0 g/dL   RDW 16.9 (H) 11.0 - 15.0 %   Platelets 302 140 - 400 K/uL   MPV 9.2 7.5 - 12.5 fL   Neutro Abs 2,754 1,500 - 7,800 cells/uL   Lymphs Abs 2,106 850 - 3,900 cells/uL   Monocytes Absolute 486 200 - 950 cells/uL   Eosinophils Absolute 54 15 - 500 cells/uL   Basophils Absolute 0 0 - 200 cells/uL   Neutrophils Relative % 51 %   Lymphocytes Relative 39 %   Monocytes Relative 9 %   Eosinophils Relative 1 %   Basophils Relative 0 %   Smear Review Criteria for review not met   Iron  Result Value Ref Range   Iron 15 (L) 40 - 190 ug/dL  IBC panel  Result Value Ref Range   UIBC 467 (H) 125 - 400 ug/dL   TIBC 482 (H) 250 - 450 ug/dL   %SAT 3 (L) 11 - 50 %       Assessment & Plan:   1. Acute blood loss anemia   2. Anxiety state   3. Obesity     Orders Placed This Encounter  Procedures  . CBC with Differential/Platelet  . Iron  . IBC panel   Meds ordered this encounter  Medications  . phentermine 37.5 MG capsule    Sig: Take 1 capsule (37.5 mg total) by mouth every morning.    Dispense:  30 capsule    Refill:  5  . ALPRAZolam (XANAX) 0.5 MG tablet    Sig: Take 1 tablet (0.5 mg total) by mouth at bedtime as  needed for sleep.    Dispense:  30 tablet    Refill:  3    Return in about 6 months (around 05/31/2016) for recheck.  Norwood Levo, M.D. [facility] [address] [place], [place]  [address] [phone] phone [phone] fax Dictation #1 [id number]  [id number]

## 2015-12-02 NOTE — Patient Instructions (Signed)
     IF you received an x-ray today, you will receive an invoice from [facility]. Please contact [facility] at [phone] with questions or concerns regarding your invoice.   IF you received labwork today, you will receive an invoice from [facility]. Please contact [facility] at [phone] with questions or concerns regarding your invoice.   Our billing staff will not be able to assist you with questions regarding bills from these companies.  You will be contacted with the lab results as soon as they are available. The fastest way to get your results is to activate your My Chart account. Instructions are located on the last page of this paperwork. If you have not heard from us regarding the results in 2 weeks, please contact this office.      

## 2015-12-08 ENCOUNTER — Encounter: Payer: Self-pay | Admitting: Family Medicine

## 2015-12-08 ENCOUNTER — Encounter: Payer: Self-pay | Admitting: Obstetrics and Gynecology

## 2015-12-22 MED ORDER — FUSION PLUS PO CAPS: 1.0000 | ORAL_CAPSULE | Freq: Every day | ORAL | 5 refills | Status: DC

## 2015-12-22 NOTE — Addendum Note (Signed)
Addended by: Wardell Honour on: 12/22/2015 11:35 AM   Modules accepted: Orders

## 2015-12-26 ENCOUNTER — Encounter: Payer: Self-pay | Admitting: Physician Assistant

## 2015-12-26 ENCOUNTER — Ambulatory Visit: Payer: Self-pay | Admitting: Physician Assistant

## 2015-12-26 VITALS — BP 120/80 | HR 68 | Temp 98.3°F

## 2015-12-26 DIAGNOSIS — E669 Obesity, unspecified: Secondary | ICD-10-CM | POA: Insufficient documentation

## 2015-12-26 DIAGNOSIS — J0100 Acute maxillary sinusitis, unspecified: Secondary | ICD-10-CM

## 2015-12-26 MED ORDER — FLUCONAZOLE 150 MG PO TABS: ORAL_TABLET | ORAL | 0 refills | Status: DC

## 2015-12-26 MED ORDER — AMOXICILLIN 875 MG PO TABS: 875.0000 mg | ORAL_TABLET | Freq: Two times a day (BID) | ORAL | 0 refills | Status: DC

## 2015-12-26 MED ORDER — PREDNISONE 10 MG (21) PO TBPK: 30.0000 mg | ORAL_TABLET | Freq: Every day | ORAL | 0 refills | Status: DC

## 2015-12-26 NOTE — Progress Notes (Signed)
S: C/o runny nose and congestion for 3 weeks, no fever, chills, cp/sob, v/d; originally was viral but now mucus is green and thick, c/o of facial and dental pain. Getting married in 2 weeks and doesn't want it to get worse  Using otc meds:   O: PE: perrl eomi, normocephalic, tms dull, nasal mucosa red and swollen, throat injected, neck supple no lymph, lungs c t a, cv rrr, neuro intact  A:  Acute sinusitis   P: drink fluids, continue regular meds , use otc meds of choice, return if not improving in 5 days, return earlier if worsening , amoxil 875, prednisone 30mg  qd, diflucan

## 2016-04-14 ENCOUNTER — Ambulatory Visit: Payer: Self-pay | Admitting: Physician Assistant

## 2016-04-15 ENCOUNTER — Ambulatory Visit: Payer: Self-pay | Admitting: Physician Assistant

## 2016-04-16 ENCOUNTER — Ambulatory Visit: Payer: Self-pay | Admitting: Physician Assistant

## 2016-04-16 ENCOUNTER — Encounter: Payer: Self-pay | Admitting: Physician Assistant

## 2016-04-16 VITALS — BP 112/80 | HR 84 | Temp 98.5°F

## 2016-04-16 DIAGNOSIS — N390 Urinary tract infection, site not specified: Secondary | ICD-10-CM | POA: Insufficient documentation

## 2016-04-16 DIAGNOSIS — K120 Recurrent oral aphthae: Secondary | ICD-10-CM

## 2016-04-16 DIAGNOSIS — R300 Dysuria: Secondary | ICD-10-CM

## 2016-04-16 LAB — POCT URINALYSIS DIPSTICK
BILIRUBIN UA: NEGATIVE
Glucose, UA: NEGATIVE
Leukocytes, UA: NEGATIVE
NITRITE UA: POSITIVE
PH UA: 6.5
SPEC GRAV UA: 1.02
UROBILINOGEN UA: 1

## 2016-04-16 MED ORDER — PHENAZOPYRIDINE HCL 200 MG PO TABS: 200.0000 mg | ORAL_TABLET | Freq: Three times a day (TID) | ORAL | 0 refills | Status: DC | PRN

## 2016-04-16 MED ORDER — FIRST-DUKES MOUTHWASH MT SUSP: 10.0000 mL | Freq: Four times a day (QID) | OROMUCOSAL | Status: DC

## 2016-04-16 MED ORDER — SULFAMETHOXAZOLE-TRIMETHOPRIM 800-160 MG PO TABS: 1.0000 | ORAL_TABLET | Freq: Two times a day (BID) | ORAL | 0 refills | Status: DC

## 2016-04-16 NOTE — Progress Notes (Signed)
   Subjective:UTI    Patient ID: Jill Ortiz, female    DOB: 1990/06/10, 26 y.o.   MRN: [id number]  HPI Patient c/o urinary urgency and dysuria for 2 days. Denes vaginal discharge, fever, or flank pain. Patient also c.o or oral lesions inner upper lip. No palliative measures for compliant.   Review of Systems Negative except for compliant.    Objective:   Physical Exam Oval lesion upper inner lip. Dip UA revealed + nitrate.       Assessment & Plan:aphthous ulcer and UTI  Duke mouthwash, Bactrim DS, and Pyridium.  Follow up with PCP.

## 2016-04-27 ENCOUNTER — Telehealth: Payer: Self-pay | Admitting: Emergency Medicine

## 2016-04-27 NOTE — Telephone Encounter (Signed)
Patient called and expressed that she has developed a yeast infection due to the antibiotics that was given to her by Ron.  I informed Ron and he authorized me to call in a Diflucan 150 in to [facility].

## 2016-06-10 ENCOUNTER — Encounter: Payer: Self-pay | Admitting: Family Medicine

## 2016-06-11 ENCOUNTER — Encounter: Payer: 59 | Admitting: Obstetrics and Gynecology

## 2016-06-14 MED ORDER — PHENTERMINE HCL 37.5 MG PO CAPS: 37.5000 mg | ORAL_CAPSULE | ORAL | 0 refills | Status: DC

## 2016-06-14 NOTE — Telephone Encounter (Signed)
Phentermine called into [facility].

## 2016-06-25 ENCOUNTER — Other Ambulatory Visit: Payer: Self-pay | Admitting: *Deleted

## 2016-06-25 MED ORDER — FLUTICASONE PROPIONATE 50 MCG/ACT NA SUSP: 2.0000 | Freq: Every day | NASAL | 5 refills | Status: DC

## 2016-06-29 ENCOUNTER — Ambulatory Visit (INDEPENDENT_AMBULATORY_CARE_PROVIDER_SITE_OTHER): Payer: 59 | Admitting: Family Medicine

## 2016-06-29 ENCOUNTER — Encounter: Payer: Self-pay | Admitting: Family Medicine

## 2016-06-29 VITALS — BP 125/82 | HR 81 | Temp 97.7°F | Resp 16 | Ht 66.0 in | Wt 206.0 lb

## 2016-06-29 DIAGNOSIS — E034 Atrophy of thyroid (acquired): Secondary | ICD-10-CM

## 2016-06-29 DIAGNOSIS — F411 Generalized anxiety disorder: Secondary | ICD-10-CM

## 2016-06-29 DIAGNOSIS — Z9884 Bariatric surgery status: Secondary | ICD-10-CM | POA: Diagnosis not present

## 2016-06-29 DIAGNOSIS — D508 Other iron deficiency anemias: Secondary | ICD-10-CM

## 2016-06-29 DIAGNOSIS — K120 Recurrent oral aphthae: Secondary | ICD-10-CM

## 2016-06-29 DIAGNOSIS — J301 Allergic rhinitis due to pollen: Secondary | ICD-10-CM | POA: Diagnosis not present

## 2016-06-29 MED ORDER — FLUTICASONE PROPIONATE 50 MCG/ACT NA SUSP: 2.0000 | Freq: Every day | NASAL | 11 refills | Status: DC

## 2016-06-29 MED ORDER — PHENTERMINE HCL 37.5 MG PO CAPS: 37.5000 mg | ORAL_CAPSULE | ORAL | 2 refills | Status: DC

## 2016-06-29 MED ORDER — ALPRAZOLAM 0.5 MG PO TABS: 0.5000 mg | ORAL_TABLET | Freq: Every evening | ORAL | 3 refills | Status: DC | PRN

## 2016-06-29 NOTE — Patient Instructions (Signed)
     IF you received an x-ray today, you will receive an invoice from [facility]. Please contact [facility] at [phone] with questions or concerns regarding your invoice.   IF you received labwork today, you will receive an invoice from [facility]. Please contact [facility] at [phone] with questions or concerns regarding your invoice.   Our billing staff will not be able to assist you with questions regarding bills from these companies.  You will be contacted with the lab results as soon as they are available. The fastest way to get your results is to activate your My Chart account. Instructions are located on the last page of this paperwork. If you have not heard from us regarding the results in 2 weeks, please contact this office.     

## 2016-06-29 NOTE — Progress Notes (Signed)
Subjective:    Patient ID: Jill Ortiz, female    DOB: 07/29/1990, 26 y.o.   MRN: [id number]  06/29/2016  Medication Refill (xanax and flonase)   HPI This 26 y.o. female presents for follow-up for anxiety, obesity, allergic rhinitis.  Gained a bit after the wedding.  Gained five pounds.   Now started running again January 2018.  Running 3 miles before work; atleast 4 days per week.  Ran outside this morning.  Treadmill in [place]; [employer]; lives across street in [place].  Husband works 6:00-2:30; big change; no longer working nights.  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    Get Fit challenge in [place]; has participated in holiday challenge.  Allergies have worsened this Spring; needing refill of Flonase.  Suffered with horrible aphthous ulcers in past few months; really concerned with severity of ulcers.    Immunization History  Administered Date(s) Administered  . Influenza Split 12/28/2014  . Tdap 03/29/2008   BP Readings from Last 3 Encounters:  06/29/16 125/82  04/16/16 112/80  12/26/15 120/80   Wt Readings from Last 3 Encounters:  06/29/16 206 lb (93.4 kg)  12/02/15 204 lb (92.5 kg)  06/10/15 204 lb (92.5 kg)    Review of Systems  Constitutional: Negative for chills, diaphoresis, fatigue and fever.  HENT: Positive for congestion, postnasal drip, rhinorrhea and sneezing.   Eyes: Negative for visual disturbance.  Respiratory: Negative for cough and shortness of breath.   Cardiovascular: Negative for chest pain, palpitations and leg swelling.  Gastrointestinal: Negative for abdominal pain, constipation, diarrhea, nausea and vomiting.  Endocrine: Negative for cold intolerance, heat intolerance, polydipsia, polyphagia and polyuria.  Neurological: Negative for dizziness, tremors, seizures, syncope, facial asymmetry, speech difficulty, weakness, light-headedness, numbness and headaches.  Psychiatric/Behavioral: The patient is nervous/anxious.     Past Medical History:    Diagnosis Date  . Anxiety   . Glucose intolerance (impaired glucose tolerance)   . Seasonal allergies    Past Surgical History:  Procedure Laterality Date  . Endocrinology consultation  09/26/2012   TSH slightly elevated, elevated cortisol.  Repeat labs normal.  Elevated cortisol due to OCPs.  No treatment warranted.  Marland Kitchen GASTRIC BYPASS  10/19/2012   [facility].  Marland Kitchen POLYSOMNOGRAPHY  08/27/2012   no OSA.  Pre-operative clearance for gastric bypass.  . TONSILLECTOMY AND ADENOIDECTOMY     No Known Allergies  Social History   Social History  . Marital status: Single    Spouse name: N/A  . Number of children: N/A  . Years of education: N/A   Occupational History  . [employer]   Social History Main Topics  . Smoking status: Never Smoker  . Smokeless tobacco: Never Used  . Alcohol use Yes  . Drug use: No  . Sexual activity: Yes    Birth control/ protection: IUD   Other Topics Concern  . Not on file   Social History Narrative   Marital status: dating seriously x 2 years.      Children; none      Lives: with dad, boyfriend.      Employment: [employer] x 2 years; happy.  Works with [employer]       Tobacco: none      Alcohol: socially      Drugs; none      Exercise:  1-2 times per week.   Family History  Problem Relation Age of Onset  . Diabetes Mother   . Hyperlipidemia Mother   . Hypertension Mother   .  COPD Mother   . Heart disease Mother     heart arrhythmia; V. Tach.  CHF  . Hyperlipidemia Father   . Diabetes Maternal Grandmother   . Diabetes Paternal Grandmother        Objective:    BP 125/82   Pulse 81   Temp 97.7 F (36.5 C) (Oral)   Resp 16   Ht 5\' 6"  (1.676 m)   Wt 206 lb (93.4 kg)   SpO2 100%   BMI 33.25 kg/m  Physical Exam  Constitutional: She is oriented to person, place, and time. She appears well-developed and well-nourished. No distress.  HENT:  Head: Normocephalic and atraumatic.  Right Ear: External ear normal.  Left Ear: External ear normal.   Nose: Nose normal.  Mouth/Throat: Oropharynx is clear and moist.  Eyes: Conjunctivae and EOM are normal. Pupils are equal, round, and reactive to light.  Neck: Normal range of motion. Neck supple. Carotid bruit is not present. No thyromegaly present.  Cardiovascular: Normal rate, regular rhythm, normal heart sounds and intact distal pulses.  Exam reveals no gallop and no friction rub.   No murmur heard. Pulmonary/Chest: Effort normal and breath sounds normal. She has no wheezes. She has no rales.  Abdominal: Soft. Bowel sounds are normal. She exhibits no distension and no mass. There is no tenderness. There is no rebound and no guarding.  Lymphadenopathy:    She has no cervical adenopathy.  Neurological: She is alert and oriented to person, place, and time. No cranial nerve deficit.  Skin: Skin is warm and dry. No rash noted. She is not diaphoretic. No erythema. No pallor.  Psychiatric: She has a normal mood and affect. Her behavior is normal. Judgment and thought content normal.   Depression screen [facility] 2/9 06/29/2016 12/02/2015 06/10/2015 02/28/2015 11/04/2014  Decreased Interest 0 0 0 0 0  Down, Depressed, Hopeless 0 0 0 0 0  PHQ - 2 Score 0 0 0 0 0        Assessment & Plan:   1. Iron deficiency anemia secondary to inadequate dietary iron intake   2. Oral aphthous ulcer   3. Hypothyroidism due to acquired atrophy of thyroid   4. S/P gastric bypass   5. Anxiety state   6. Seasonal allergic rhinitis due to pollen    -weight is stable; agreeable to Phentermine 37.5mg  daily for the next three months and then will need to wean to 15mg  daily for one month and then stop.  -obtain labs with recent illness and due to gastric bypass status. -refill of Xanax yet advised patient that I recommend weaning to off in upcoming six months. -refill of Flonase for recurrent allergic rhinitis.   Orders Placed This Encounter  Procedures  . CBC with Differential/Platelet  . Comprehensive metabolic panel   . VITAMIN D 25 Hydroxy (Vit-D Deficiency, Fractures)  . Vitamin B12  . TSH  . T4, free  . Iron   Meds ordered this encounter  Medications  . ALPRAZolam (XANAX) 0.5 MG tablet    Sig: Take 1 tablet (0.5 mg total) by mouth at bedtime as needed for sleep.    Dispense:  30 tablet    Refill:  3  . phentermine 37.5 MG capsule    Sig: Take 1 capsule (37.5 mg total) by mouth every morning.    Dispense:  30 capsule    Refill:  2  . fluticasone (FLONASE) 50 MCG/ACT nasal spray    Sig: Place 2 sprays into both nostrils daily.  Dispense:  16 g    Refill:  11    No Follow-up on file.   Rayman Petrosian Elayne Guerin, M.D. Primary Care at [facility] previously [facility] [address] [place], [place]  [address] [phone] phone [phone] fax

## 2016-06-30 LAB — TSH: TSH: 3.91 u[IU]/mL (ref 0.450–4.500)

## 2016-06-30 LAB — CBC WITH DIFFERENTIAL/PLATELET
BASOS ABS: 0 10*3/uL (ref 0.0–0.2)
Basos: 0 %
EOS (ABSOLUTE): 0.1 10*3/uL (ref 0.0–0.4)
Eos: 1 %
HEMOGLOBIN: 14.4 g/dL (ref 11.1–15.9)
Hematocrit: 44.6 % (ref 34.0–46.6)
Immature Grans (Abs): 0 10*3/uL (ref 0.0–0.1)
Immature Granulocytes: 0 %
LYMPHS ABS: 1.7 10*3/uL (ref 0.7–3.1)
Lymphs: 29 %
MCH: 29.9 pg (ref 26.6–33.0)
MCHC: 32.3 g/dL (ref 31.5–35.7)
MCV: 93 fL (ref 79–97)
MONOS ABS: 0.4 10*3/uL (ref 0.1–0.9)
Monocytes: 6 %
NEUTROS ABS: 3.7 10*3/uL (ref 1.4–7.0)
Neutrophils: 64 %
Platelets: 254 10*3/uL (ref 150–379)
RBC: 4.82 x10E6/uL (ref 3.77–5.28)
RDW: 14.1 % (ref 12.3–15.4)
WBC: 5.9 10*3/uL (ref 3.4–10.8)

## 2016-06-30 LAB — COMPREHENSIVE METABOLIC PANEL
ALBUMIN: 4.7 g/dL (ref 3.5–5.5)
ALK PHOS: 76 IU/L (ref 39–117)
ALT: 15 IU/L (ref 0–32)
AST: 22 IU/L (ref 0–40)
Albumin/Globulin Ratio: 2.2 (ref 1.2–2.2)
BILIRUBIN TOTAL: 0.3 mg/dL (ref 0.0–1.2)
BUN / CREAT RATIO: 8 — AB (ref 9–23)
BUN: 7 mg/dL (ref 6–20)
CHLORIDE: 102 mmol/L (ref 96–106)
CO2: 24 mmol/L (ref 18–29)
Calcium: 9.9 mg/dL (ref 8.7–10.2)
Creatinine, Ser: 0.88 mg/dL (ref 0.57–1.00)
GFR calc Af Amer: 103 mL/min/{1.73_m2} (ref >59)
GFR calc non Af Amer: 90 mL/min/{1.73_m2} (ref >59)
GLUCOSE: 91 mg/dL (ref 65–99)
Globulin, Total: 2.1 g/dL (ref 1.5–4.5)
Potassium: 4.1 mmol/L (ref 3.5–5.2)
SODIUM: 143 mmol/L (ref 134–144)
Total Protein: 6.8 g/dL (ref 6.0–8.5)

## 2016-06-30 LAB — IRON: Iron: 83 ug/dL (ref 27–159)

## 2016-06-30 LAB — VITAMIN B12: Vitamin B-12: 339 pg/mL (ref 232–1245)

## 2016-06-30 LAB — VITAMIN D 25 HYDROXY (VIT D DEFICIENCY, FRACTURES): VIT D 25 HYDROXY: 42.3 ng/mL (ref 30.0–100.0)

## 2016-06-30 LAB — T4, FREE: Free T4: 1.13 ng/dL (ref 0.82–1.77)

## 2016-07-23 ENCOUNTER — Encounter: Payer: 59 | Admitting: Obstetrics and Gynecology

## 2016-07-25 DIAGNOSIS — J301 Allergic rhinitis due to pollen: Secondary | ICD-10-CM | POA: Insufficient documentation

## 2016-08-26 ENCOUNTER — Encounter: Payer: 59 | Admitting: Obstetrics and Gynecology

## 2016-10-08 ENCOUNTER — Ambulatory Visit: Payer: Self-pay | Admitting: Physician Assistant

## 2016-10-15 ENCOUNTER — Encounter: Payer: Self-pay | Admitting: Family Medicine

## 2016-10-18 MED ORDER — PHENTERMINE HCL 15 MG PO CAPS: 15.0000 mg | ORAL_CAPSULE | ORAL | 1 refills | Status: DC

## 2016-10-18 NOTE — Telephone Encounter (Signed)
Please call in/fax in refill for Phentermine 15mg  as approved.

## 2016-10-27 ENCOUNTER — Encounter: Payer: Self-pay | Admitting: Obstetrics and Gynecology

## 2016-10-27 ENCOUNTER — Ambulatory Visit (INDEPENDENT_AMBULATORY_CARE_PROVIDER_SITE_OTHER): Payer: 59 | Admitting: Obstetrics and Gynecology

## 2016-10-27 VITALS — BP 124/84 | HR 100 | Ht 67.0 in | Wt 206.9 lb

## 2016-10-27 DIAGNOSIS — Z30432 Encounter for removal of intrauterine contraceptive device: Secondary | ICD-10-CM | POA: Diagnosis not present

## 2016-10-27 MED ORDER — NORETHIN-ETH ESTRADIOL-FE 0.4-35 MG-MCG PO CHEW: 1.0000 | CHEWABLE_TABLET | Freq: Every day | ORAL | 4 refills | Status: DC

## 2016-10-27 NOTE — Patient Instructions (Signed)
Preventive Care 18-39 Years, Female Preventive care refers to lifestyle choices and visits with your health care provider that can promote health and wellness. What does preventive care include?  A yearly physical exam. This is also called an annual well check.  Dental exams once or twice a year.  Routine eye exams. Ask your health care provider how often you should have your eyes checked.  Personal lifestyle choices, including: ? Daily care of your teeth and gums. ? Regular physical activity. ? Eating a healthy diet. ? Avoiding tobacco and drug use. ? Limiting alcohol use. ? Practicing safe sex. ? Taking vitamin and mineral supplements as recommended by your health care provider. What happens during an annual well check? The services and screenings done by your health care provider during your annual well check will depend on your age, overall health, lifestyle risk factors, and family history of disease. Counseling Your health care provider may ask you questions about your:  Alcohol use.  Tobacco use.  Drug use.  Emotional well-being.  Home and relationship well-being.  Sexual activity.  Eating habits.  Work and work Statistician.  Method of birth control.  Menstrual cycle.  Pregnancy history.  Screening You may have the following tests or measurements:  Height, weight, and BMI.  Diabetes screening. This is done by checking your blood sugar (glucose) after you have not eaten for a while (fasting).  Blood pressure.  Lipid and cholesterol levels. These may be checked every 5 years starting at age 38.  Skin check.  Hepatitis C blood test.  Hepatitis B blood test.  Sexually transmitted disease (STD) testing.  BRCA-related cancer screening. This may be done if you have a family history of breast, ovarian, tubal, or peritoneal cancers.  Pelvic exam and Pap test. This may be done every 3 years starting at age 38. Starting at age 30, this may be done  every 5 years if you have a Pap test in combination with an HPV test.  Discuss your test results, treatment options, and if necessary, the need for more tests with your health care provider. Vaccines Your health care provider may recommend certain vaccines, such as:  Influenza vaccine. This is recommended every year.  Tetanus, diphtheria, and acellular pertussis (Tdap, Td) vaccine. You may need a Td booster every 10 years.  Varicella vaccine. You may need this if you have not been vaccinated.  HPV vaccine. If you are 39 or younger, you may need three doses over 6 months.  Measles, mumps, and rubella (MMR) vaccine. You may need at least one dose of MMR. You may also need a second dose.  Pneumococcal 13-valent conjugate (PCV13) vaccine. You may need this if you have certain conditions and were not previously vaccinated.  Pneumococcal polysaccharide (PPSV23) vaccine. You may need one or two doses if you smoke cigarettes or if you have certain conditions.  Meningococcal vaccine. One dose is recommended if you are age 68-21 years and a first-year college student living in a residence hall, or if you have one of several medical conditions. You may also need additional booster doses.  Hepatitis A vaccine. You may need this if you have certain conditions or if you travel or work in places where you may be exposed to hepatitis A.  Hepatitis B vaccine. You may need this if you have certain conditions or if you travel or work in places where you may be exposed to hepatitis B.  Haemophilus influenzae type b (Hib) vaccine. You may need this  if you have certain risk factors.  Talk to your health care provider about which screenings and vaccines you need and how often you need them. This information is not intended to replace advice given to you by your health care provider. Make sure you discuss any questions you have with your health care provider. Document Released: 05/11/2001 Document Revised:  12/03/2015 Document Reviewed: 01/14/2015 Elsevier Interactive Patient Education  2017 Elsevier Inc.  

## 2016-10-27 NOTE — Progress Notes (Signed)
  Subjective:    here for removal of IUD and switching to another [facility], as they plan to try for pregnancy next year. Desires OCPs.  Jill Ortiz is a 26 y.o. year old No obstetric history on file. Caucasian female who presents for removal of a Mirena IUD. Her Mirena IUD was placed 4 years ago.   Patient's last menstrual period was 10/13/2016. BP 124/84   Pulse 100   Ht 5\' 7"  (1.702 m)   Wt 206 lb 14.4 oz (93.8 kg)   LMP 10/13/2016   BMI 32.41 kg/m   Time out was performed.  A pederson speculum was placed in the vagina.  The cervix was visualized, and the strings were visible. They were grasped and the Mirena was easily removed intact without complications.   F/U in September as planned for AE  Melody Rockney Ghee, CNM

## 2016-11-24 ENCOUNTER — Ambulatory Visit: Payer: Self-pay | Admitting: Family Medicine

## 2017-03-23 ENCOUNTER — Encounter: Payer: Self-pay | Admitting: Family Medicine

## 2017-03-28 ENCOUNTER — Ambulatory Visit: Payer: Self-pay | Admitting: Emergency Medicine

## 2017-03-28 VITALS — BP 110/70 | HR 79 | Temp 98.3°F | Resp 16

## 2017-03-28 DIAGNOSIS — R300 Dysuria: Secondary | ICD-10-CM

## 2017-03-28 MED ORDER — CIPROFLOXACIN HCL 250 MG PO TABS: 250.0000 mg | ORAL_TABLET | Freq: Two times a day (BID) | ORAL | 0 refills | Status: DC

## 2017-03-28 NOTE — Progress Notes (Signed)
Subjective.   Patient enters with onset yesterday morning of burning on urination. Patient has a sensation of urgency and pressure in her suprapubic area. She has no history of kidney stones. She states she gets urinary tract infection about one time a year and this is consistent with how she typically feels.  Objective.  There is no CVA tenderness. Abdomen is flat. There are no areas of tenderness no rebound.  Assessment.  Patient presents with urinary frequency urgency and dysuria. Her urine is unremarkable except for 3+ blood. Patient states she is on her menstrual cycle. The urine is dilute and the patient states she has had 4 glasses of water today so I suspect her urine is dilute from that.  Plan  We'll treat with Cipro 250 twice a day for 3 days. Birth control backup advised. Continue to force fluids.

## 2017-04-01 MED ORDER — PHENTERMINE HCL 15 MG PO CAPS
15.0000 mg | ORAL_CAPSULE | ORAL | 0 refills | Status: DC
Start: 2017-04-01 — End: 2017-04-25

## 2017-04-25 ENCOUNTER — Ambulatory Visit (INDEPENDENT_AMBULATORY_CARE_PROVIDER_SITE_OTHER): Payer: No Typology Code available for payment source | Admitting: Family Medicine

## 2017-04-25 ENCOUNTER — Other Ambulatory Visit: Payer: Self-pay

## 2017-04-25 ENCOUNTER — Encounter: Payer: Self-pay | Admitting: Family Medicine

## 2017-04-25 VITALS — BP 116/72 | HR 104 | Temp 98.0°F | Resp 16 | Ht 66.14 in | Wt 217.0 lb

## 2017-04-25 DIAGNOSIS — F99 Mental disorder, not otherwise specified: Secondary | ICD-10-CM

## 2017-04-25 DIAGNOSIS — F5105 Insomnia due to other mental disorder: Secondary | ICD-10-CM | POA: Diagnosis not present

## 2017-04-25 DIAGNOSIS — Z9884 Bariatric surgery status: Secondary | ICD-10-CM | POA: Diagnosis not present

## 2017-04-25 DIAGNOSIS — E6609 Other obesity due to excess calories: Secondary | ICD-10-CM

## 2017-04-25 DIAGNOSIS — B009 Herpesviral infection, unspecified: Secondary | ICD-10-CM | POA: Diagnosis not present

## 2017-04-25 DIAGNOSIS — F411 Generalized anxiety disorder: Secondary | ICD-10-CM

## 2017-04-25 DIAGNOSIS — Z6834 Body mass index (BMI) 34.0-34.9, adult: Secondary | ICD-10-CM

## 2017-04-25 MED ORDER — TOPIRAMATE 25 MG PO CPSP: 25.0000 mg | ORAL_CAPSULE | Freq: Two times a day (BID) | ORAL | 5 refills | Status: DC

## 2017-04-25 MED ORDER — ALPRAZOLAM 0.5 MG PO TABS: 0.5000 mg | ORAL_TABLET | Freq: Every evening | ORAL | 3 refills | Status: AC | PRN

## 2017-04-25 MED ORDER — PHENTERMINE HCL 37.5 MG PO TABS: 37.5000 mg | ORAL_TABLET | Freq: Every day | ORAL | 1 refills | Status: DC

## 2017-04-25 MED ORDER — VALACYCLOVIR HCL 1 G PO TABS: 2000.0000 mg | ORAL_TABLET | Freq: Once | ORAL | 1 refills | Status: AC

## 2017-04-25 NOTE — Progress Notes (Signed)
Subjective:    Patient ID: Jill Ortiz, female    DOB: [birth year], 27 y.o.   MRN: [id number]  04/25/2017  Anxiety (follow-up )    HPI This 27 y.o. female presents for nine month follow-up of obesity, anxiety, herpes labialis, insomnia.  Management changes made at last visit include the following: -weight is stable; agreeable to Phentermine 37.5mg  daily for the next three months and then will need to wean to 15mg  daily for one month and then stop.  -obtain labs with recent illness and due to patient's gastric bypass status.  All labs normal.  B12 borderline at 339. -refill of Xanax yet advised patient that I recommend weaning to off in upcoming six months. -refill of Flonase for recurrent allergic rhinitis.  Patient sent Mychart message on 03/23/17 requesting Phentermine refill due to weight gain over the holidays; weighed 215 on 03/23/17.  Scheduled appointment with provider for today.  Phentermine 15mg  daily provided.   Update since MyChart message on 03/23/17: Started adding back exercise gradually.   213 this morning. Goal is 180 pounds.   Has new provider at [facility] who prescribed a patient Topamax for weight loss. Phentermine Capsules are more expensive than tablets.  Curious about adding Topamax.  Weaned Phentermine at visit in 06/2016; finally stopped Phentermine in August or September.   Loses effectiveness during the day.  Increased appetite since stopping Phentermine after last visit.     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  Taking A&P.  Only does night program every 2 years; two more years before can get in.  Must take CNA again.  Entrance exam.  Then nursing school for two years.  Wants to do FNP.    Had IUD removed; taking OPC currently; easier to stop when ready to try.  Anxiety: admits to work stressors.  Patient has returned to school.  Rashena in school. Wilhemena Durie is leaving. [place] and [place].  Jiles Garter is doing Lead stuff.   Zettie Cooley is Glass blower/designer.  Brayton Layman is Surveyor, quantity.    HSV labialis: requesting Valtrex.  Suffered with likely primary outbreak of HSV in the past year.  Now getting HSV labialis outbreak with menses every month.  Requesting Valtrex prescription.  Insomnia: Using alprazolam 3 nights per week to assist with insomnia.  Rarely will need during the day with work stressors.   BP Readings from Last 3 Encounters:  04/25/17 116/72  03/28/17 110/70  10/27/16 124/84   Wt Readings from Last 3 Encounters:  04/25/17 217 lb (98.4 kg)  10/27/16 206 lb 14.4 oz (93.8 kg)  06/29/16 206 lb (93.4 kg)   Immunization History  Administered Date(s) Administered  . Influenza Split 12/28/2014  . Influenza, Seasonal, Injecte, Preservative Fre 01/30/2005, 01/09/2007  . Tdap 03/29/2008    Review of Systems  Constitutional: Negative for chills, diaphoresis, fatigue and fever.  HENT: Positive for mouth sores.   Eyes: Negative for visual disturbance.  Respiratory: Negative for cough and shortness of breath.   Cardiovascular: Negative for chest pain, palpitations and leg swelling.  Gastrointestinal: Negative for abdominal pain, constipation, diarrhea, nausea and vomiting.  Endocrine: Negative for cold intolerance, heat intolerance, polydipsia, polyphagia and polyuria.  Neurological: Negative for dizziness, tremors, seizures, syncope, facial asymmetry, speech difficulty, weakness, light-headedness, numbness and headaches.  Psychiatric/Behavioral: Positive for sleep disturbance. Negative for self-injury and suicidal ideas. The patient is nervous/anxious.     Past Medical History:  Diagnosis Date  . Anxiety   . Glucose intolerance (impaired  glucose tolerance)   . Seasonal allergies    Past Surgical History:  Procedure Laterality Date  . Endocrinology consultation  09/26/2012   TSH slightly elevated, elevated cortisol.  Repeat labs normal.  Elevated cortisol due to OCPs.  No treatment warranted.  Marland Kitchen  GASTRIC BYPASS  10/19/2012   [facility].  Marland Kitchen POLYSOMNOGRAPHY  08/27/2012   no OSA.  Pre-operative clearance for gastric bypass.  . TONSILLECTOMY AND ADENOIDECTOMY     No Known Allergies Current Outpatient Medications on File Prior to Visit  Medication Sig Dispense Refill  . b complex vitamins tablet Take 1 tablet by mouth daily.    Marland Kitchen BIOTIN PO Take 500 mcg by mouth daily.    . Calcium-Vitamin D-Vitamin K [id number] MG-UNT-MCG CHEW Chew by mouth 2 (two) times daily.    . Cyanocobalamin (VITAMIN B12 PO) Take 2,500 mcg by mouth daily.    . fluticasone (FLONASE) 50 MCG/ACT nasal spray Place 2 sprays into both nostrils daily. 16 g 11  . Multiple Vitamin (MULTIVITAMIN) tablet Take 1 tablet by mouth daily.    Cyndie Chime Estradiol-Fe [facility] FE,WYMZYA Orrin Brigham) 0.4-35 MG-MCG tablet Chew 1 tablet by mouth daily. 3 Package 4   No current facility-administered medications on file prior to visit.    Social History   Socioeconomic History  . Marital status: Single    Spouse name: Not on file  . Number of children: Not on file  . Years of education: Not on file  . Highest education level: Not on file  Social Needs  . Financial resource strain: Not on file  . Food insecurity - worry: Not on file  . Food insecurity - inability: Not on file  . Transportation needs - medical: Not on file  . Transportation needs - non-medical: Not on file  Occupational History  . Occupation: CMA    Employer: [employer]  Tobacco Use  . Smoking status: Never Smoker  . Smokeless tobacco: Never Used  Substance and Sexual Activity  . Alcohol use: Yes  . Drug use: No  . Sexual activity: Yes    Birth control/protection: IUD    Comment: mirena  Other Topics Concern  . Not on file  Social History Narrative   Marital status: dating seriously x 2 years.      Children; none      Lives: with dad, boyfriend.      Employment: [employer] x 2 years; happy.  Works with [employer]       Tobacco: none      Alcohol:  socially      Drugs; none      Exercise:  1-2 times per week.   Family History  Problem Relation Age of Onset  . Diabetes Mother   . Hyperlipidemia Mother   . Hypertension Mother   . COPD Mother   . Heart disease Mother        heart arrhythmia; V. Tach.  CHF  . Hyperlipidemia Father   . Diabetes Maternal Grandmother   . Diabetes Paternal Grandmother        Objective:    BP 116/72   Pulse (!) 104   Temp 98 F (36.7 C) (Oral)   Resp 16   Ht 5' 6.14" (1.68 m)   Wt 217 lb (98.4 kg)   LMP 04/25/2017   SpO2 98%   BMI 34.87 kg/m  Physical Exam  Constitutional: She is oriented to person, place, and time. She appears well-developed and well-nourished. No distress.  HENT:  Head: Normocephalic and  atraumatic.    Right Ear: External ear normal.  Left Ear: External ear normal.  Nose: Nose normal.  Mouth/Throat: Oropharynx is clear and moist.  Small healing vesicles in the right upper vermilion border and buccal mucosa.  Eyes: Conjunctivae and EOM are normal. Pupils are equal, round, and reactive to light.  Neck: Normal range of motion. Neck supple. Carotid bruit is not present. No thyromegaly present.  Cardiovascular: Normal rate, regular rhythm, normal heart sounds and intact distal pulses. Exam reveals no gallop and no friction rub.  No murmur heard. Pulmonary/Chest: Effort normal and breath sounds normal. She has no wheezes. She has no rales.  Lymphadenopathy:    She has no cervical adenopathy.  Neurological: She is alert and oriented to person, place, and time. No cranial nerve deficit.  Skin: Skin is warm and dry. Rash noted. She is not diaphoretic. No erythema. No pallor.  Psychiatric: She has a normal mood and affect. Her behavior is normal.   No results found. Depression screen [facility] 2/9 04/25/2017 06/29/2016 12/02/2015 06/10/2015 02/28/2015  Decreased Interest 0 0 0 0 0  Down, Depressed, Hopeless 0 0 0 0 0  PHQ - 2 Score 0 0 0 0 0   Fall Risk  04/25/2017 06/29/2016 12/02/2015  06/10/2015 02/28/2015  Falls in the past year? No No No No No        Assessment & Plan:   1. Anxiety state   2. HSV (herpes simplex virus) infection   3. Insomnia due to other mental disorder   4. Class 1 obesity due to excess calories without serious comorbidity with body mass index (BMI) of 34.0 to 34.9 in adult   5. S/P gastric bypass     Insomnia and intermittent anxiety due to work stressors: Persistent.  Counseled patient to use alprazolam sparingly.  Discussed risk of dementia with long-term use.  Encourage daily exercise to help with insomnia and anxiety management.  Also limit caffeine to before 12:00 noon.  New onset HSV labialis: Occurs every month with menses.  Patient can utilize 1 of 2 ways: #1 with onset of symptoms, take 2 tablets and repeat in 12 hours.  #2 take 1 tablet daily of the week of menses for prevention.  Obesity status post gastric bypass: 12 pound weight gain in the past 10 months.  Restarted Phentermine 15mg  daily last month.  Agreeable to addition of Topamax 25 mg twice daily.  Rx for phentermine 37.5 mg one half daily.  Restart jogging program 4-5 days weekly.  Avoid carbonated beverages.  No orders of the defined types were placed in this encounter.  Meds ordered this encounter  Medications  . ALPRAZolam (XANAX) 0.5 MG tablet    Sig: Take 1 tablet (0.5 mg total) by mouth at bedtime as needed for sleep.    Dispense:  30 tablet    Refill:  3  . topiramate (TOPAMAX) 25 MG capsule    Sig: Take 1 capsule (25 mg total) by mouth 2 (two) times daily.    Dispense:  60 capsule    Refill:  5  . phentermine (ADIPEX-P) 37.5 MG tablet    Sig: Take 1 tablet (37.5 mg total) by mouth daily before breakfast.    Dispense:  30 tablet    Refill:  1  . valACYclovir (VALTREX) 1000 MG tablet    Sig: Take 2 tablets (2,000 mg total) by mouth once for 1 dose. Repeat 12 hours later.    Dispense:  30 tablet    Refill:  1    Return in about 3 months (around 07/24/2017) for  recheck.   Joselin Crandell Elayne Guerin, M.D. [facility] [address] [place], [place]  [address] [phone] phone [phone] fax

## 2017-04-25 NOTE — Patient Instructions (Addendum)
   IF you received an x-ray today, you will receive an invoice from [facility]. Please contact [facility] at [phone] with questions or concerns regarding your invoice.   IF you received labwork today, you will receive an invoice from [facility]. Please contact [facility] at [phone] with questions or concerns regarding your invoice.   Our billing staff will not be able to assist you with questions regarding bills from these companies.  You will be contacted with the lab results as soon as they are available. The fastest way to get your results is to activate your My Chart account. Instructions are located on the last page of this paperwork. If you have not heard from us regarding the results in 2 weeks, please contact this office.     Calorie Counting for Weight Loss Calories are units of energy. Your body needs a certain amount of calories from food to keep you going throughout the day. When you eat more calories than your body needs, your body stores the extra calories as fat. When you eat fewer calories than your body needs, your body burns fat to get the energy it needs. Calorie counting means keeping track of how many calories you eat and drink each day. Calorie counting can be helpful if you need to lose weight. If you make sure to eat fewer calories than your body needs, you should lose weight. Ask your health care provider what a healthy weight is for you. For calorie counting to work, you will need to eat the right number of calories in a day in order to lose a healthy amount of weight per week. A dietitian can help you determine how many calories you need in a day and will give you suggestions on how to reach your calorie goal.  A healthy amount of weight to lose per week is usually 1-2 lb (0.5-0.9 kg). This usually means that your daily calorie intake should be reduced by 500-750 calories.  Eating 1,200 - 1,500 calories per day can help most women lose  weight.  Eating 1,500 - 1,800 calories per day can help most men lose weight. What is my plan? My goal is to have __________ calories per day. If I have this many calories per day, I should lose around __________ pounds per week. What do I need to know about calorie counting? In order to meet your daily calorie goal, you will need to:  Find out how many calories are in each food you would like to eat. Try to do this before you eat.  Decide how much of the food you plan to eat.  Write down what you ate and how many calories it had. Doing this is called keeping a food log. To successfully lose weight, it is important to balance calorie counting with a healthy lifestyle that includes regular activity. Aim for 150 minutes of moderate exercise (such as walking) or 75 minutes of vigorous exercise (such as running) each week. Where do I find calorie information?   The number of calories in a food can be found on a Nutrition Facts label. If a food does not have a Nutrition Facts label, try to look up the calories online or ask your dietitian for help. Remember that calories are listed per serving. If you choose to have more than one serving of a food, you will have to multiply the calories per serving by the amount of servings you plan to eat. For example, the label on a package of   bread might say that a serving size is 1 slice and that there are 90 calories in a serving. If you eat 1 slice, you will have eaten 90 calories. If you eat 2 slices, you will have eaten 180 calories. How do I keep a food log? Immediately after each meal, record the following information in your food log:  What you ate. Don't forget to include toppings, sauces, and other extras on the food.  How much you ate. This can be measured in cups, ounces, or number of items.  How many calories each food and drink had.  The total number of calories in the meal. Keep your food log near you, such as in a small notebook in your  pocket, or use a mobile app or website. Some programs will calculate calories for you and show you how many calories you have left for the day to meet your goal. What are some calorie counting tips?  Use your calories on foods and drinks that will fill you up and not leave you hungry:  Some examples of foods that fill you up are nuts and nut butters, vegetables, lean proteins, and high-fiber foods like whole grains. High-fiber foods are foods with more than 5 g fiber per serving.  Drinks such as sodas, specialty coffee drinks, alcohol, and juices have a lot of calories, yet do not fill you up.  Eat nutritious foods and avoid empty calories. Empty calories are calories you get from foods or beverages that do not have many vitamins or protein, such as candy, sweets, and soda. It is better to have a nutritious high-calorie food (such as an avocado) than a food with few nutrients (such as a bag of chips).  Know how many calories are in the foods you eat most often. This will help you calculate calorie counts faster.  Pay attention to calories in drinks. Low-calorie drinks include water and unsweetened drinks.  Pay attention to nutrition labels for "low fat" or "fat free" foods. These foods sometimes have the same amount of calories or more calories than the full fat versions. They also often have added sugar, starch, or salt, to make up for flavor that was removed with the fat.  Find a way of tracking calories that works for you. Get creative. Try different apps or programs if writing down calories does not work for you. What are some portion control tips?  Know how many calories are in a serving. This will help you know how many servings of a certain food you can have.  Use a measuring cup to measure serving sizes. You could also try weighing out portions on a kitchen scale. With time, you will be able to estimate serving sizes for some foods.  Take some time to put servings of different foods  on your favorite plates, bowls, and cups so you know what a serving looks like.  Try not to eat straight from a bag or box. Doing this can lead to overeating. Put the amount you would like to eat in a cup or on a plate to make sure you are eating the right portion.  Use smaller plates, glasses, and bowls to prevent overeating.  Try not to multitask (for example, watch TV or use your computer) while eating. If it is time to eat, sit down at a table and enjoy your food. This will help you to know when you are full. It will also help you to be aware of what you are eating and   how much you are eating. What are tips for following this plan? Reading food labels   Check the calorie count compared to the serving size. The serving size may be smaller than what you are used to eating.  Check the source of the calories. Make sure the food you are eating is high in vitamins and protein and low in saturated and trans fats. Shopping   Read nutrition labels while you shop. This will help you make healthy decisions before you decide to purchase your food.  Make a grocery list and stick to it. Cooking   Try to cook your favorite foods in a healthier way. For example, try baking instead of frying.  Use low-fat dairy products. Meal planning   Use more fruits and vegetables. Half of your plate should be fruits and vegetables.  Include lean proteins like poultry and fish. How do I count calories when eating out?  Ask for smaller portion sizes.  Consider sharing an entree and sides instead of getting your own entree.  If you get your own entree, eat only half. Ask for a box at the beginning of your meal and put the rest of your entree in it so you are not tempted to eat it.  If calories are listed on the menu, choose the lower calorie options.  Choose dishes that include vegetables, fruits, whole grains, low-fat dairy products, and lean protein.  Choose items that are boiled, broiled, grilled, or  steamed. Stay away from items that are buttered, battered, fried, or served with cream sauce. Items labeled "crispy" are usually fried, unless stated otherwise.  Choose water, low-fat milk, unsweetened iced tea, or other drinks without added sugar. If you want an alcoholic beverage, choose a lower calorie option such as a glass of wine or light beer.  Ask for dressings, sauces, and syrups on the side. These are usually high in calories, so you should limit the amount you eat.  If you want a salad, choose a garden salad and ask for grilled meats. Avoid extra toppings like bacon, cheese, or fried items. Ask for the dressing on the side, or ask for olive oil and vinegar or lemon to use as dressing.  Estimate how many servings of a food you are given. For example, a serving of cooked rice is  cup or about the size of half a baseball. Knowing serving sizes will help you be aware of how much food you are eating at restaurants. The list below tells you how big or small some common portion sizes are based on everyday objects:  1 oz-4 stacked dice.  3 oz-1 deck of cards.  1 tsp-1 die.  1 Tbsp- a ping-pong ball.  2 Tbsp-1 ping-pong ball.   cup- baseball.  1 cup-1 baseball. Summary  Calorie counting means keeping track of how many calories you eat and drink each day. If you eat fewer calories than your body needs, you should lose weight.  A healthy amount of weight to lose per week is usually 1-2 lb (0.5-0.9 kg). This usually means reducing your daily calorie intake by 500-750 calories.  The number of calories in a food can be found on a Nutrition Facts label. If a food does not have a Nutrition Facts label, try to look up the calories online or ask your dietitian for help.  Use your calories on foods and drinks that will fill you up, and not on foods and drinks that will leave you hungry.  Use smaller plates, glasses,   bowls to prevent overeating. This information is not intended to  replace advice given to you by your health care provider. Make sure you discuss any questions you have with your health care provider. Document Released: 03/15/2005 Document Revised: 02/13/2016 Document Reviewed: 02/13/2016 Elsevier Interactive Patient Education  2018 [employer]  

## 2017-04-27 ENCOUNTER — Encounter: Payer: Self-pay | Admitting: Family Medicine

## 2017-04-27 DIAGNOSIS — Z9884 Bariatric surgery status: Secondary | ICD-10-CM | POA: Insufficient documentation

## 2017-04-27 DIAGNOSIS — F99 Mental disorder, not otherwise specified: Secondary | ICD-10-CM

## 2017-04-27 DIAGNOSIS — F5105 Insomnia due to other mental disorder: Secondary | ICD-10-CM | POA: Insufficient documentation

## 2017-07-25 ENCOUNTER — Other Ambulatory Visit: Payer: Self-pay

## 2017-07-25 ENCOUNTER — Encounter: Payer: Self-pay | Admitting: Family Medicine

## 2017-07-25 ENCOUNTER — Ambulatory Visit (INDEPENDENT_AMBULATORY_CARE_PROVIDER_SITE_OTHER): Payer: No Typology Code available for payment source | Admitting: Family Medicine

## 2017-07-25 VITALS — BP 122/72 | HR 78 | Temp 98.0°F | Resp 16 | Ht 66.54 in | Wt 201.0 lb

## 2017-07-25 DIAGNOSIS — Z9884 Bariatric surgery status: Secondary | ICD-10-CM | POA: Diagnosis not present

## 2017-07-25 DIAGNOSIS — F5105 Insomnia due to other mental disorder: Secondary | ICD-10-CM | POA: Diagnosis not present

## 2017-07-25 DIAGNOSIS — F411 Generalized anxiety disorder: Secondary | ICD-10-CM | POA: Diagnosis not present

## 2017-07-25 DIAGNOSIS — E6609 Other obesity due to excess calories: Secondary | ICD-10-CM

## 2017-07-25 DIAGNOSIS — F99 Mental disorder, not otherwise specified: Secondary | ICD-10-CM

## 2017-07-25 DIAGNOSIS — Z6831 Body mass index (BMI) 31.0-31.9, adult: Secondary | ICD-10-CM

## 2017-07-25 DIAGNOSIS — J301 Allergic rhinitis due to pollen: Secondary | ICD-10-CM

## 2017-07-25 MED ORDER — PHENTERMINE HCL 37.5 MG PO TABS: 37.5000 mg | ORAL_TABLET | Freq: Every day | ORAL | 2 refills | Status: DC

## 2017-07-25 MED ORDER — FLUOXETINE HCL 20 MG PO TABS: 20.0000 mg | ORAL_TABLET | Freq: Every day | ORAL | 3 refills | Status: DC

## 2017-07-25 MED ORDER — FLUTICASONE PROPIONATE 50 MCG/ACT NA SUSP: 2.0000 | Freq: Every day | NASAL | 11 refills | Status: DC

## 2017-07-25 NOTE — Patient Instructions (Addendum)
Dr. Parks Ranger at [facility] Dr. Deborra Medina at [facility]   IF you received an x-ray today, you will receive an invoice from [facility]. Please contact [facility] at [phone] with questions or concerns regarding your invoice.   IF you received labwork today, you will receive an invoice from [place]. Please contact [facility] at [phone] with questions or concerns regarding your invoice.   Our billing staff will not be able to assist you with questions regarding bills from these companies.  You will be contacted with the lab results as soon as they are available. The fastest way to get your results is to activate your My Chart account. Instructions are located on the last page of this paperwork. If you have not heard from [place] regarding the results in 2 weeks, please contact this office.     Allergic Rhinitis, Adult Allergic rhinitis is an allergic reaction that affects the mucous membrane inside the nose. It causes sneezing, a runny or stuffy nose, and the feeling of mucus going down the back of the throat (postnasal drip). Allergic rhinitis can be mild to severe. There are two types of allergic rhinitis:  Seasonal. This type is also called hay fever. It happens only during certain seasons.  Perennial. This type can happen at any time of the year.  What are the causes? This condition happens when the body's defense system (immune system) responds to certain harmless substances called allergens as though they were germs.  Seasonal allergic rhinitis is triggered by pollen, which can come from grasses, trees, and weeds. Perennial allergic rhinitis may be caused by:  House dust mites.  Pet dander.  Mold spores.  What are the signs or symptoms? Symptoms of this condition include:  Sneezing.  Runny or stuffy nose (nasal congestion).  Postnasal drip.  Itchy nose.  Tearing of the eyes.  Trouble sleeping.  Daytime sleepiness.  How is  this diagnosed? This condition may be diagnosed based on:  Your medical history.  A physical exam.  Tests to check for related conditions, such as: ? Asthma. ? Pink eye. ? Ear infection. ? Upper respiratory infection.  Tests to find out which allergens trigger your symptoms. These may include skin or blood tests.  How is this treated? There is no cure for this condition, but treatment can help control symptoms. Treatment may include:  Taking medicines that block allergy symptoms, such as antihistamines. Medicine may be given as a shot, nasal spray, or pill.  Avoiding the allergen.  Desensitization. This treatment involves getting ongoing shots until your body becomes less sensitive to the allergen. This treatment may be done if other treatments do not help.  If taking medicine and avoiding the allergen does not work, new, stronger medicines may be prescribed.  Follow these instructions at home:  Find out what you are allergic to. Common allergens include smoke, dust, and pollen.  Avoid the things you are allergic to. These are some things you can do to help avoid allergens: ? Replace carpet with wood, tile, or vinyl flooring. Carpet can trap dander and dust. ? Do not smoke. Do not allow smoking in your home. ? Change your heating and air conditioning filter at least once a month. ? During allergy season:  Keep windows closed as much as possible.  Plan outdoor activities when pollen counts are lowest. This is usually during the evening hours.  When coming indoors, change clothing and shower before sitting on furniture or bedding.  Take over-the-counter and prescription medicines  only as told by your health care provider.  Keep all follow-up visits as told by your health care provider. This is important. Contact a health care provider if:  You have a fever.  You develop a persistent cough.  You make whistling sounds when you breathe (you wheeze).  Your symptoms  interfere with your normal daily activities. Get help right away if:  You have shortness of breath. Summary  This condition can be managed by taking medicines as directed and avoiding allergens.  Contact your health care provider if you develop a persistent cough or fever.  During allergy season, keep windows closed as much as possible. This information is not intended to replace advice given to you by your health care provider. Make sure you discuss any questions you have with your health care provider. Document Released: 12/08/2000 Document Revised: 04/22/2016 Document Reviewed: 04/22/2016 Elsevier Interactive Patient Education  [employer].

## 2017-07-25 NOTE — Progress Notes (Signed)
Subjective:    Patient ID: Jill Ortiz, female    DOB: 1990/11/04, 27 y.o.   MRN: [id number]  07/25/2017  Weight Check (pt states everything has been going ok and need refills on Phentermine )    HPI This 27 y.o. female presents for evaluation of obesity, insomnia with stress reaction.  Management changes made at last visit include the following:  Insomnia and intermittent anxiety due to work stressors: Persistent.  Counseled patient to use alprazolam sparingly.  Discussed risk of dementia with long-term use.  Encourage daily exercise to help with insomnia and anxiety management.  Also limit caffeine to before 12:00 noon.  New onset HSV labialis: Occurs every month with menses.  Patient can utilize 1 of 2 ways: #1 with onset of symptoms, take 2 tablets and repeat in 12 hours.  #2 take 1 tablet daily of the week of menses for prevention.  Obesity status post gastric bypass: 12 pound weight gain in the past 10 months.  Restarted Phentermine 15mg  daily last month.  Agreeable to addition of Topamax 25 mg twice daily.  Rx for phentermine 37.5 mg one half daily.  Restart jogging program 4-5 days weekly.  Avoid carbonated beverages.  UPDATE: Ran at lunch today. Didn't start take Topamax until spring break. Might affect focus; was taking whole Phentermine until then.  Goal is 180 lb.   Jogging regularly. Had to drop A&P. Trying to get into it in the fall.  Does not want to do Sertraline. Gained a lot of weight with Sertraline; took when mother passed away. Cannot take Xanax and take a test. Went through testing as a child.   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   Mom took Sertraline; sister and father are not on anything.   BP Readings from Last 3 Encounters:  07/25/17 122/72  04/25/17 116/72  03/28/17 110/70   Wt Readings from Last 3 Encounters:  07/25/17 201 lb (91.2 kg)  04/25/17 217 lb (98.4 kg)  10/27/16 206 lb 14.4 oz (93.8 kg)   Immunization  History  Administered Date(s) Administered  . Influenza Split 12/28/2014  . Influenza, Seasonal, Injecte, Preservative Fre 01/30/2005, 01/09/2007  . Tdap 03/29/2008    Review of Systems  Constitutional: Positive for unexpected weight change. Negative for chills, diaphoresis, fatigue and fever.  Eyes: Negative for visual disturbance.  Respiratory: Negative for cough and shortness of breath.   Cardiovascular: Negative for chest pain, palpitations and leg swelling.  Gastrointestinal: Negative for abdominal pain, constipation, diarrhea, nausea and vomiting.  Endocrine: Negative for cold intolerance, heat intolerance, polydipsia, polyphagia and polyuria.  Neurological: Negative for dizziness, tremors, seizures, syncope, facial asymmetry, speech difficulty, weakness, light-headedness, numbness and headaches.  Psychiatric/Behavioral: Positive for sleep disturbance. Negative for decreased concentration, dysphoric mood, self-injury and suicidal ideas. The patient is nervous/anxious.     Past Medical History:  Diagnosis Date  . Anxiety   . Glucose intolerance (impaired glucose tolerance)   . Seasonal allergies    Past Surgical History:  Procedure Laterality Date  . Endocrinology consultation  09/26/2012   TSH slightly elevated, elevated cortisol.  Repeat labs normal.  Elevated cortisol due to OCPs.  No treatment warranted.  Marland Kitchen GASTRIC BYPASS  10/19/2012   [facility].  Marland Kitchen POLYSOMNOGRAPHY  08/27/2012   no OSA.  Pre-operative clearance for gastric bypass.  . TONSILLECTOMY AND ADENOIDECTOMY     No Known Allergies Current Outpatient Medications on File Prior to Visit  Medication Sig Dispense Refill  . ALPRAZolam (XANAX) 0.5 MG tablet  Take 1 tablet (0.5 mg total) by mouth at bedtime as needed for sleep. 30 tablet 3  . b complex vitamins tablet Take 1 tablet by mouth daily.    Marland Kitchen BIOTIN PO Take 500 mcg by mouth daily.    . Calcium-Vitamin D-Vitamin K 409-811-91 MG-UNT-MCG CHEW Chew by mouth 2 (two) times  daily.    . Cyanocobalamin (VITAMIN B12 PO) Take 2,500 mcg by mouth daily.    . Multiple Vitamin (MULTIVITAMIN) tablet Take 1 tablet by mouth daily.    Cyndie Chime Estradiol-Fe [facility] FE,WYMZYA Orrin Brigham) 0.4-35 MG-MCG tablet Chew 1 tablet by mouth daily. 3 Package 4  . topiramate (TOPAMAX) 25 MG capsule Take 1 capsule (25 mg total) by mouth 2 (two) times daily. 60 capsule 5   No current facility-administered medications on file prior to visit.    Social History   Socioeconomic History  . Marital status: Single    Spouse name: Not on file  . Number of children: Not on file  . Years of education: Not on file  . Highest education level: Not on file  Occupational History  . Occupation: CMA    Employer: [employer]  . Financial resource strain: Not on file  . Food insecurity:    Worry: Not on file    Inability: Not on file  . Transportation needs:    Medical: Not on file    Non-medical: Not on file  Tobacco Use  . Smoking status: Never Smoker  . Smokeless tobacco: Never Used  Substance and Sexual Activity  . Alcohol use: Yes  . Drug use: No  . Sexual activity: Yes    Birth control/protection: IUD    Comment: mirena  Lifestyle  . Physical activity:    Days per week: Not on file    Minutes per session: Not on file  . Stress: Not on file  Relationships  . Social connections:    Talks on phone: Not on file    Gets together: Not on file    Attends religious service: Not on file    Active member of club or organization: Not on file    Attends meetings of clubs or organizations: Not on file    Relationship status: Not on file  . Intimate partner violence:    Fear of current or ex partner: Not on file    Emotionally abused: Not on file    Physically abused: Not on file    Forced sexual activity: Not on file  Other Topics Concern  . Not on file  Social History Narrative   Marital status: dating seriously x 2 years.      Children; none       Lives: with dad, boyfriend.      Employment: [employer] x 2 years; happy.  Works with [employer]       Tobacco: none      Alcohol: socially      Drugs; none      Exercise:  1-2 times per week.   Family History  Problem Relation Age of Onset  . Diabetes Mother   . Hyperlipidemia Mother   . Hypertension Mother   . COPD Mother   . Heart disease Mother        heart arrhythmia; V. Tach.  CHF  . Hyperlipidemia Father   . Diabetes Maternal Grandmother   . Diabetes Paternal Grandmother        Objective:    BP 122/72   Pulse 78  Temp 98 F (36.7 C) (Oral)   Resp 16   Ht 5' 6.54" (1.69 m)   Wt 201 lb (91.2 kg)   SpO2 98%   BMI 31.92 kg/m  Physical Exam  Constitutional: She is oriented to person, place, and time. She appears well-developed and well-nourished. No distress.  HENT:  Head: Normocephalic and atraumatic.  Right Ear: External ear normal.  Left Ear: External ear normal.  Nose: Nose normal.  Mouth/Throat: Oropharynx is clear and moist.  Eyes: Pupils are equal, round, and reactive to light. Conjunctivae and EOM are normal.  Neck: Normal range of motion. Neck supple. Carotid bruit is not present. No thyromegaly present.  Cardiovascular: Normal rate, regular rhythm, normal heart sounds and intact distal pulses. Exam reveals no gallop and no friction rub.  No murmur heard. Pulmonary/Chest: Effort normal and breath sounds normal. She has no wheezes. She has no rales.  Abdominal: Soft. Bowel sounds are normal. She exhibits no distension and no mass. There is no tenderness. There is no rebound and no guarding.  Lymphadenopathy:    She has no cervical adenopathy.  Neurological: She is alert and oriented to person, place, and time. She displays normal reflexes. No cranial nerve deficit or sensory deficit. She exhibits normal muscle tone. Coordination normal.  Skin: Skin is warm and dry. No rash noted. She is not diaphoretic. No erythema. No pallor.  Psychiatric: She has a normal mood and  affect. Her behavior is normal. Judgment and thought content normal.   No results found. Depression screen [facility] 2/9 07/25/2017 04/25/2017 06/29/2016 12/02/2015 06/10/2015  Decreased Interest 0 0 0 0 0  Down, Depressed, Hopeless 0 0 0 0 0  PHQ - 2 Score 0 0 0 0 0   Fall Risk  07/25/2017 04/25/2017 06/29/2016 12/02/2015 06/10/2015  Falls in the past year? No No No No No        Assessment & Plan:   1. Anxiety state   2. Insomnia due to other mental disorder   3. S/P gastric bypass   4. Seasonal allergic rhinitis due to pollen     Anxiety generalized; worsening due to work stressors and school stressors; initiate Prozac 20mg  daily. Continue Xanax sparingly for insomnia.  S/p gastric bypass for morbid obesity: with recent weight gain; tolerating Topamax and Phentermine.  Refills provided.  Allergic rhinitis: uncontrolled; refill of Flonase provided.   No orders of the defined types were placed in this encounter.  Meds ordered this encounter  Medications  . FLUoxetine (PROZAC) 20 MG tablet    Sig: Take 1 tablet (20 mg total) by mouth daily.    Dispense:  30 tablet    Refill:  3  . phentermine (ADIPEX-P) 37.5 MG tablet    Sig: Take 1 tablet (37.5 mg total) by mouth daily before breakfast.    Dispense:  30 tablet    Refill:  2  . fluticasone (FLONASE) 50 MCG/ACT nasal spray    Sig: Place 2 sprays into both nostrils daily.    Dispense:  16 g    Refill:  11    Return in about 3 months (around 10/24/2017) for complete physical examiniation.   Kristi Elayne Guerin, M.D. Primary Care at [facility] [address] [place], [place]  [phone] phone [phone] fax

## 2017-08-01 ENCOUNTER — Encounter: Payer: Self-pay | Admitting: Family Medicine

## 2017-08-01 DIAGNOSIS — Z131 Encounter for screening for diabetes mellitus: Secondary | ICD-10-CM

## 2017-08-01 DIAGNOSIS — F411 Generalized anxiety disorder: Secondary | ICD-10-CM

## 2017-08-01 DIAGNOSIS — Z9884 Bariatric surgery status: Secondary | ICD-10-CM

## 2017-08-01 DIAGNOSIS — Z13220 Encounter for screening for lipoid disorders: Secondary | ICD-10-CM

## 2017-08-12 ENCOUNTER — Encounter: Payer: Self-pay | Admitting: Family Medicine

## 2017-08-16 NOTE — Telephone Encounter (Signed)
I have placed a future lab requisition in the patient's chart.  Please fax the [facility] requisition to the noted fax number in the associated mychart message within this encounter.

## 2017-08-17 ENCOUNTER — Encounter: Payer: Self-pay | Admitting: Family Medicine

## 2017-08-19 ENCOUNTER — Ambulatory Visit (INDEPENDENT_AMBULATORY_CARE_PROVIDER_SITE_OTHER): Payer: No Typology Code available for payment source | Admitting: Family Medicine

## 2017-08-19 ENCOUNTER — Encounter: Payer: Self-pay | Admitting: Family Medicine

## 2017-08-19 ENCOUNTER — Other Ambulatory Visit: Payer: Self-pay

## 2017-08-19 VITALS — BP 110/80 | HR 96 | Temp 98.0°F | Resp 16 | Ht 66.14 in | Wt 197.6 lb

## 2017-08-19 DIAGNOSIS — Z131 Encounter for screening for diabetes mellitus: Secondary | ICD-10-CM

## 2017-08-19 DIAGNOSIS — F99 Mental disorder, not otherwise specified: Secondary | ICD-10-CM

## 2017-08-19 DIAGNOSIS — Z803 Family history of malignant neoplasm of breast: Secondary | ICD-10-CM

## 2017-08-19 DIAGNOSIS — Z0000 Encounter for general adult medical examination without abnormal findings: Secondary | ICD-10-CM

## 2017-08-19 DIAGNOSIS — Z23 Encounter for immunization: Secondary | ICD-10-CM

## 2017-08-19 DIAGNOSIS — Z124 Encounter for screening for malignant neoplasm of cervix: Secondary | ICD-10-CM

## 2017-08-19 DIAGNOSIS — F411 Generalized anxiety disorder: Secondary | ICD-10-CM

## 2017-08-19 DIAGNOSIS — J301 Allergic rhinitis due to pollen: Secondary | ICD-10-CM

## 2017-08-19 DIAGNOSIS — Z9884 Bariatric surgery status: Secondary | ICD-10-CM

## 2017-08-19 DIAGNOSIS — F5105 Insomnia due to other mental disorder: Secondary | ICD-10-CM

## 2017-08-19 DIAGNOSIS — Z13220 Encounter for screening for lipoid disorders: Secondary | ICD-10-CM

## 2017-08-19 MED ORDER — TOPIRAMATE 25 MG PO CPSP: 25.0000 mg | ORAL_CAPSULE | Freq: Two times a day (BID) | ORAL | 3 refills | Status: DC

## 2017-08-19 MED ORDER — FLUOXETINE HCL 20 MG PO TABS: 20.0000 mg | ORAL_TABLET | Freq: Every day | ORAL | 3 refills | Status: DC

## 2017-08-19 MED ORDER — PHENTERMINE HCL 37.5 MG PO TABS: 37.5000 mg | ORAL_TABLET | Freq: Every day | ORAL | 5 refills | Status: DC

## 2017-08-19 MED ORDER — NORETHIN-ETH ESTRADIOL-FE 0.4-35 MG-MCG PO CHEW: 1.0000 | CHEWABLE_TABLET | Freq: Every day | ORAL | 4 refills | Status: DC

## 2017-08-19 NOTE — Patient Instructions (Addendum)
IF you received an x-ray today, you will receive an invoice from [facility]. Please contact [facility] at [phone] with questions or concerns regarding your invoice.   IF you received labwork today, you will receive an invoice from [place]. Please contact [facility] at [phone] with questions or concerns regarding your invoice.   Our billing staff will not be able to assist you with questions regarding bills from these companies.  You will be contacted with the lab results as soon as they are available. The fastest way to get your results is to activate your My Chart account. Instructions are located on the last page of this paperwork. If you have not heard from [place] regarding the results in 2 weeks, please contact this office.      Preventive Care 18-39 Years, Female Preventive care refers to lifestyle choices and visits with your health care provider that can promote health and wellness. What does preventive care include?  A yearly physical exam. This is also called an annual well check.  Dental exams once or twice a year.  Routine eye exams. Ask your health care provider how often you should have your eyes checked.  Personal lifestyle choices, including: ? Daily care of your teeth and gums. ? Regular physical activity. ? Eating a healthy diet. ? Avoiding tobacco and drug use. ? Limiting alcohol use. ? Practicing safe sex. ? Taking vitamin and mineral supplements as recommended by your health care provider. What happens during an annual well check? The services and screenings done by your health care provider during your annual well check will depend on your age, overall health, lifestyle risk factors, and family history of disease. Counseling Your health care provider may ask you questions about your:  Alcohol use.  Tobacco use.  Drug use.  Emotional well-being.  Home and relationship well-being.  Sexual activity.  Eating  habits.  Work and work Statistician.  Method of birth control.  Menstrual cycle.  Pregnancy history.  Screening You may have the following tests or measurements:  Height, weight, and BMI.  Diabetes screening. This is done by checking your blood sugar (glucose) after you have not eaten for a while (fasting).  Blood pressure.  Lipid and cholesterol levels. These may be checked every 5 years starting at age 26.  Skin check.  Hepatitis C blood test.  Hepatitis B blood test.  Sexually transmitted disease (STD) testing.  BRCA-related cancer screening. This may be done if you have a family history of breast, ovarian, tubal, or peritoneal cancers.  Pelvic exam and Pap test. This may be done every 3 years starting at age 34. Starting at age 75, this may be done every 5 years if you have a Pap test in combination with an HPV test.  Discuss your test results, treatment options, and if necessary, the need for more tests with your health care provider. Vaccines Your health care provider may recommend certain vaccines, such as:  Influenza vaccine. This is recommended every year.  Tetanus, diphtheria, and acellular pertussis (Tdap, Td) vaccine. You may need a Td booster every 10 years.  Varicella vaccine. You may need this if you have not been vaccinated.  HPV vaccine. If you are 88 or younger, you may need three doses over 6 months.  Measles, mumps, and rubella (MMR) vaccine. You may need at least one dose of MMR. You may also need a second dose.  Pneumococcal 13-valent conjugate (PCV13) vaccine. You may need this if you have certain conditions  and were not previously vaccinated.  Pneumococcal polysaccharide (PPSV23) vaccine. You may need one or two doses if you smoke cigarettes or if you have certain conditions.  Meningococcal vaccine. One dose is recommended if you are age 66-21 years and a first-year college student living in a residence hall, or if you have one of several  medical conditions. You may also need additional booster doses.  Hepatitis A vaccine. You may need this if you have certain conditions or if you travel or work in places where you may be exposed to hepatitis A.  Hepatitis B vaccine. You may need this if you have certain conditions or if you travel or work in places where you may be exposed to hepatitis B.  Haemophilus influenzae type b (Hib) vaccine. You may need this if you have certain risk factors.  Talk to your health care provider about which screenings and vaccines you need and how often you need them. This information is not intended to replace advice given to you by your health care provider. Make sure you discuss any questions you have with your health care provider. Document Released: 05/11/2001 Document Revised: 12/03/2015 Document Reviewed: 01/14/2015 Elsevier Interactive Patient Education  [employer].

## 2017-08-19 NOTE — Progress Notes (Signed)
Subjective:    Patient ID: Jill Ortiz, female    DOB: Apr 15, 1990, 27 y.o.   MRN: [id number]  08/19/2017  Annual Exam    HPI This 27 y.o. female presents for COMPLETE PHYSICAL EXAMINATION.    Immunization History  Administered Date(s) Administered  . Influenza Split 12/28/2014  . Influenza, Seasonal, Injecte, Preservative Fre 01/30/2005, 01/09/2007  . Tdap 03/29/2008, 08/19/2017   Health Maintenance  Topic Date Due  . INFLUENZA VACCINE  10/27/2017  . PAP SMEAR  08/19/2020  . TETANUS/TDAP  08/20/2027  . HIV Screening  Completed   Review of Systems  Constitutional: Negative for activity change, appetite change, chills, diaphoresis, fatigue, fever and unexpected weight change.  HENT: Negative for congestion, dental problem, drooling, ear discharge, ear pain, facial swelling, hearing loss, mouth sores, nosebleeds, postnasal drip, rhinorrhea, sinus pressure, sneezing, sore throat, tinnitus, trouble swallowing and voice change.   Eyes: Negative for photophobia, pain, discharge, redness, itching and visual disturbance.  Respiratory: Negative for apnea, cough, choking, chest tightness, shortness of breath, wheezing and stridor.   Cardiovascular: Negative for chest pain, palpitations and leg swelling.  Gastrointestinal: Negative for abdominal distention, abdominal pain, anal bleeding, blood in stool, constipation, diarrhea, nausea, rectal pain and vomiting.  Endocrine: Negative for cold intolerance, heat intolerance, polydipsia, polyphagia and polyuria.  Genitourinary: Negative for decreased urine volume, difficulty urinating, dyspareunia, dysuria, enuresis, flank pain, frequency, genital sores, hematuria, menstrual problem, pelvic pain, urgency, vaginal bleeding, vaginal discharge and vaginal pain.       Nocturia.  Musculoskeletal: Negative for arthralgias, back pain, gait problem, joint swelling, myalgias, neck pain and neck stiffness.  Skin: Negative for color change, pallor,  rash and wound.  Allergic/Immunologic: Negative for environmental allergies, food allergies and immunocompromised state.  Neurological: Negative for dizziness, tremors, seizures, syncope, facial asymmetry, speech difficulty, weakness, light-headedness, numbness and headaches.  Hematological: Negative for adenopathy. Does not bruise/bleed easily.  Psychiatric/Behavioral: Negative for agitation, behavioral problems, confusion, decreased concentration, dysphoric mood, hallucinations, self-injury, sleep disturbance and suicidal ideas. The patient is not nervous/anxious and is not hyperactive.        Bedtime 900; wakes up 430.    Past Medical History:  Diagnosis Date  . Anxiety   . Glucose intolerance (impaired glucose tolerance)   . Seasonal allergies    Past Surgical History:  Procedure Laterality Date  . Endocrinology consultation  09/26/2012   TSH slightly elevated, elevated cortisol.  Repeat labs normal.  Elevated cortisol due to OCPs.  No treatment warranted.  Marland Kitchen GASTRIC BYPASS  10/19/2012   [facility].  Marland Kitchen POLYSOMNOGRAPHY  08/27/2012   no OSA.  Pre-operative clearance for gastric bypass.  . TONSILLECTOMY AND ADENOIDECTOMY     No Known Allergies Current Outpatient Medications on File Prior to Visit  Medication Sig Dispense Refill  . ALPRAZolam (XANAX) 0.5 MG tablet Take 1 tablet (0.5 mg total) by mouth at bedtime as needed for sleep. 30 tablet 3  . b complex vitamins tablet Take 1 tablet by mouth daily.    Marland Kitchen BIOTIN PO Take 500 mcg by mouth daily.    . Calcium-Vitamin D-Vitamin K 725-366-44 MG-UNT-MCG CHEW Chew by mouth 2 (two) times daily.    . Cyanocobalamin (VITAMIN B12 PO) Take 2,500 mcg by mouth daily.    . fluticasone (FLONASE) 50 MCG/ACT nasal spray Place 2 sprays into both nostrils daily. 16 g 11  . Multiple Vitamin (MULTIVITAMIN) tablet Take 1 tablet by mouth daily.     No current facility-administered medications on file prior to  visit.    Social History   Socioeconomic History    . Marital status: Single    Spouse name: Not on file  . Number of children: Not on file  . Years of education: Not on file  . Highest education level: Not on file  Occupational History  . Occupation: CMA    Employer: [employer]  . Financial resource strain: Not on file  . Food insecurity:    Worry: Not on file    Inability: Not on file  . Transportation needs:    Medical: Not on file    Non-medical: Not on file  Tobacco Use  . Smoking status: Never Smoker  . Smokeless tobacco: Never Used  Substance and Sexual Activity  . Alcohol use: Yes  . Drug use: No  . Sexual activity: Yes    Birth control/protection: IUD    Comment: mirena  Lifestyle  . Physical activity:    Days per week: Not on file    Minutes per session: Not on file  . Stress: Not on file  Relationships  . Social connections:    Talks on phone: Not on file    Gets together: Not on file    Attends religious service: Not on file    Active member of club or organization: Not on file    Attends meetings of clubs or organizations: Not on file    Relationship status: Not on file  . Intimate partner violence:    Fear of current or ex partner: Not on file    Emotionally abused: Not on file    Physically abused: Not on file    Forced sexual activity: Not on file  Other Topics Concern  . Not on file  Social History Narrative   Marital status: married      Children; none      Lives: with husband      Employment: [employer]; working with [employer].  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      Education: applying to nursing school.      Tobacco: none      Alcohol: socially weekends      Drugs; none      Exercise:  3 times per week.  Jogging.        Seatbelt: 100%   Family History  Problem Relation Age of Onset  . Diabetes Mother   . Hyperlipidemia Mother   . Hypertension Mother   . COPD Mother   . Heart disease Mother        heart arrhythmia; V. Tach.  CHF  . Hyperlipidemia Father   . Diabetes Maternal  Grandmother   . Diabetes Paternal Grandmother        Objective:    BP 110/80   Pulse 96   Temp 98 F (36.7 C) (Oral)   Resp 16   Ht 5' 6.14" (1.68 m)   Wt 197 lb 9.6 oz (89.6 kg)   LMP 08/19/2017   SpO2 98%   BMI 31.76 kg/m  Physical Exam  Constitutional: She is oriented to person, place, and time. She appears well-developed and well-nourished. No distress.  HENT:  Head: Normocephalic and atraumatic.  Right Ear: External ear normal.  Left Ear: External ear normal.  Nose: Nose normal.  Mouth/Throat: Oropharynx is clear and moist.  Eyes: Pupils are equal, round, and reactive to light. Conjunctivae and EOM are normal.  Neck: Normal range of motion and full passive range of motion without pain. Neck supple. No JVD present.  Carotid bruit is not present. No thyromegaly present.  Cardiovascular: Normal rate, regular rhythm and normal heart sounds. Exam reveals no gallop and no friction rub.  No murmur heard. Pulmonary/Chest: Effort normal and breath sounds normal. She has no wheezes. She has no rales. Right breast exhibits no inverted nipple, no mass, no nipple discharge, no skin change and no tenderness. Left breast exhibits no inverted nipple, no mass, no nipple discharge, no skin change and no tenderness. No breast swelling, tenderness, discharge or bleeding. Breasts are symmetrical.  Abdominal: Soft. Bowel sounds are normal. She exhibits no distension and no mass. There is no tenderness. There is no rebound and no guarding.  Genitourinary: Vagina normal and uterus normal. No breast swelling, tenderness, discharge or bleeding. There is no rash, tenderness, lesion or injury on the right labia. There is no rash, tenderness, lesion or injury on the left labia. Cervix exhibits no motion tenderness, no discharge and no friability. Right adnexum displays no mass, no tenderness and no fullness. Left adnexum displays no mass, no tenderness and no fullness.  Musculoskeletal:       Right  shoulder: Normal.       Left shoulder: Normal.       Cervical back: Normal.  Lymphadenopathy:    She has no cervical adenopathy.  Neurological: She is alert and oriented to person, place, and time. She has normal reflexes. No cranial nerve deficit. She exhibits normal muscle tone. Coordination normal.  Skin: Skin is warm and dry. No rash noted. She is not diaphoretic. No erythema. No pallor.  Psychiatric: She has a normal mood and affect. Her behavior is normal. Judgment and thought content normal.  Nursing note and vitals reviewed.  No results found.        Assessment & Plan:   1. Routine physical examination   2. Seasonal allergic rhinitis due to pollen   3. Insomnia due to other mental disorder   4. Anxiety state   5. Family history of breast cancer   6. S/P gastric bypass   7. Screening, lipid   8. Screening for diabetes mellitus   9. Screening for cervical cancer   10. Need for Tdap vaccination     -anticipatory guidance provided --- exercise, weight loss, safe driving practices. -obtain age appropriate screening labs and labs for chronic disease management. -pap smear obtained; OCP for contraception. -s/p TDAP  Orders Placed This Encounter  Procedures  . Tdap vaccine greater than or equal to 7yo IM  . TSH  . Specimen status report  . Lipid panel  . Specimen status report   Meds ordered this encounter  Medications  . FLUoxetine (PROZAC) 20 MG tablet    Sig: Take 1 tablet (20 mg total) by mouth daily.    Dispense:  90 tablet    Refill:  3  . Norethin-Eth Estradiol-Fe [facility] FE,WYMZYA FE,ZENCHENT FE,ZEOSA) 0.4-35 MG-MCG tablet    Sig: Chew 1 tablet by mouth daily.    Dispense:  3 Package    Refill:  4  . phentermine (ADIPEX-P) 37.5 MG tablet    Sig: Take 1 tablet (37.5 mg total) by mouth daily before breakfast.    Dispense:  30 tablet    Refill:  5  . topiramate (TOPAMAX) 25 MG capsule    Sig: Take 1 capsule (25 mg total) by mouth 2 (two) times daily.     Dispense:  180 capsule    Refill:  3    Return in about 3 months (around 11/19/2017) for  follow-up chronic medical conditions.   Jill Ortiz Elayne Guerin, M.D. Primary Care at [facility] previously [facility] [place] [address] [place], [place]  [address] [phone] phone [phone] fax

## 2017-08-25 LAB — PAP IG AND HPV HIGH-RISK
HPV, high-risk: NEGATIVE
PAP SMEAR COMMENT: 0

## 2017-08-27 LAB — CBC WITH DIFFERENTIAL/PLATELET
BASOS: 1 %
Basophils Absolute: 0 10*3/uL (ref 0.0–0.2)
EOS (ABSOLUTE): 0.1 10*3/uL (ref 0.0–0.4)
EOS: 1 %
HEMATOCRIT: 43.1 % (ref 34.0–46.6)
Hemoglobin: 13.9 g/dL (ref 11.1–15.9)
Immature Grans (Abs): 0 10*3/uL (ref 0.0–0.1)
Immature Granulocytes: 0 %
LYMPHS ABS: 1.6 10*3/uL (ref 0.7–3.1)
Lymphs: 37 %
MCH: 31.1 pg (ref 26.6–33.0)
MCHC: 32.3 g/dL (ref 31.5–35.7)
MCV: 96 fL (ref 79–97)
Monocytes Absolute: 0.3 10*3/uL (ref 0.1–0.9)
Monocytes: 6 %
Neutrophils Absolute: 2.4 10*3/uL (ref 1.4–7.0)
Neutrophils: 55 %
Platelets: 255 10*3/uL (ref 150–450)
RBC: 4.47 x10E6/uL (ref 3.77–5.28)
RDW: 13.4 % (ref 12.3–15.4)
WBC: 4.4 10*3/uL (ref 3.4–10.8)

## 2017-08-27 LAB — COMPREHENSIVE METABOLIC PANEL
ALBUMIN: 4.7 g/dL (ref 3.5–5.5)
ALK PHOS: 57 IU/L (ref 39–117)
ALT: 16 IU/L (ref 0–32)
AST: 19 IU/L (ref 0–40)
Albumin/Globulin Ratio: 2.2 (ref 1.2–2.2)
BUN / CREAT RATIO: 11 (ref 9–23)
BUN: 11 mg/dL (ref 6–20)
Bilirubin Total: 0.3 mg/dL (ref 0.0–1.2)
CO2: 21 mmol/L (ref 20–29)
CREATININE: 0.96 mg/dL (ref 0.57–1.00)
Calcium: 9.4 mg/dL (ref 8.7–10.2)
Chloride: 102 mmol/L (ref 96–106)
GFR calc Af Amer: 92 mL/min/{1.73_m2} (ref >59)
GFR calc non Af Amer: 80 mL/min/{1.73_m2} (ref >59)
GLOBULIN, TOTAL: 2.1 g/dL (ref 1.5–4.5)
Glucose: 85 mg/dL (ref 65–99)
Potassium: 3.8 mmol/L (ref 3.5–5.2)
SODIUM: 138 mmol/L (ref 134–144)
Total Protein: 6.8 g/dL (ref 6.0–8.5)

## 2017-08-27 LAB — HEMOGLOBIN A1C
Est. average glucose Bld gHb Est-mCnc: 97 mg/dL
Hgb A1c MFr Bld: 5 % (ref 4.8–5.6)

## 2017-08-27 LAB — IRON: Iron: 107 ug/dL (ref 27–159)

## 2017-08-28 LAB — VITAMIN B12: VITAMIN B 12: 190 pg/mL — AB (ref 232–1245)

## 2017-08-28 LAB — LIPID PANEL
CHOLESTEROL TOTAL: 130 mg/dL (ref 100–199)
Chol/HDL Ratio: 2.7 ratio (ref 0.0–4.4)
HDL: 48 mg/dL (ref >39)
LDL CALC: 58 mg/dL (ref 0–99)
Triglycerides: 120 mg/dL (ref 0–149)
VLDL Cholesterol Cal: 24 mg/dL (ref 5–40)

## 2017-08-28 LAB — SPECIMEN STATUS REPORT

## 2017-08-29 LAB — TSH: TSH: 3.53 u[IU]/mL (ref 0.450–4.500)

## 2017-08-29 LAB — VITAMIN D 25 HYDROXY (VIT D DEFICIENCY, FRACTURES): VIT D 25 HYDROXY: 48.8 ng/mL (ref 30.0–100.0)

## 2017-08-29 LAB — SPECIMEN STATUS REPORT

## 2017-10-17 ENCOUNTER — Ambulatory Visit (INDEPENDENT_AMBULATORY_CARE_PROVIDER_SITE_OTHER): Payer: No Typology Code available for payment source | Admitting: Family Medicine

## 2017-10-17 ENCOUNTER — Encounter: Payer: Self-pay | Admitting: Family Medicine

## 2017-10-17 ENCOUNTER — Ambulatory Visit (INDEPENDENT_AMBULATORY_CARE_PROVIDER_SITE_OTHER): Payer: No Typology Code available for payment source

## 2017-10-17 ENCOUNTER — Other Ambulatory Visit: Payer: Self-pay

## 2017-10-17 VITALS — BP 118/70 | HR 122 | Temp 98.8°F | Resp 16 | Ht 66.54 in | Wt 191.0 lb

## 2017-10-17 DIAGNOSIS — B373 Candidiasis of vulva and vagina: Secondary | ICD-10-CM

## 2017-10-17 DIAGNOSIS — E538 Deficiency of other specified B group vitamins: Secondary | ICD-10-CM | POA: Diagnosis not present

## 2017-10-17 DIAGNOSIS — J9801 Acute bronchospasm: Secondary | ICD-10-CM | POA: Diagnosis not present

## 2017-10-17 DIAGNOSIS — R87610 Atypical squamous cells of undetermined significance on cytologic smear of cervix (ASC-US): Secondary | ICD-10-CM | POA: Diagnosis not present

## 2017-10-17 DIAGNOSIS — Z9884 Bariatric surgery status: Secondary | ICD-10-CM | POA: Diagnosis not present

## 2017-10-17 DIAGNOSIS — B3731 Acute candidiasis of vulva and vagina: Secondary | ICD-10-CM

## 2017-10-17 DIAGNOSIS — J22 Unspecified acute lower respiratory infection: Secondary | ICD-10-CM | POA: Diagnosis not present

## 2017-10-17 DIAGNOSIS — B001 Herpesviral vesicular dermatitis: Secondary | ICD-10-CM | POA: Diagnosis not present

## 2017-10-17 MED ORDER — ALBUTEROL SULFATE (2.5 MG/3ML) 0.083% IN NEBU
2.5000 mg | INHALATION_SOLUTION | Freq: Once | RESPIRATORY_TRACT | Status: AC
  Administered 2017-10-17: 2.5 mg via RESPIRATORY_TRACT

## 2017-10-17 MED ORDER — FLUCONAZOLE 150 MG PO TABS: 150.0000 mg | ORAL_TABLET | Freq: Once | ORAL | 0 refills | Status: AC

## 2017-10-17 MED ORDER — ALBUTEROL SULFATE HFA 108 (90 BASE) MCG/ACT IN AERS: 2.0000 | INHALATION_SPRAY | RESPIRATORY_TRACT | 1 refills | Status: AC | PRN

## 2017-10-17 MED ORDER — "INSULIN SYRINGE-NEEDLE U-100 28G X 1/2"" 0.3 ML MISC": 1.0000 | 0 refills | Status: DC

## 2017-10-17 MED ORDER — IPRATROPIUM BROMIDE 0.02 % IN SOLN
0.5000 mg | Freq: Once | RESPIRATORY_TRACT | Status: AC
Start: 2017-10-17 — End: 2017-10-17
  Administered 2017-10-17: 0.5 mg via RESPIRATORY_TRACT

## 2017-10-17 MED ORDER — VALACYCLOVIR HCL 1 G PO TABS: 2000.0000 mg | ORAL_TABLET | Freq: Two times a day (BID) | ORAL | 1 refills | Status: DC

## 2017-10-17 MED ORDER — CYANOCOBALAMIN 1000 MCG/ML IJ SOLN: 1000.0000 ug | INTRAMUSCULAR | 3 refills | Status: AC

## 2017-10-17 MED ORDER — AZITHROMYCIN 250 MG PO TABS: ORAL_TABLET | ORAL | 0 refills | Status: DC

## 2017-10-17 MED ORDER — PREDNISONE 20 MG PO TABS: ORAL_TABLET | ORAL | 0 refills | Status: DC

## 2017-10-17 MED ORDER — CYANOCOBALAMIN 1000 MCG/ML IJ SOLN
1000.0000 ug | INTRAMUSCULAR | Status: AC
  Administered 2017-10-17: 1000 ug via INTRAMUSCULAR

## 2017-10-17 NOTE — Patient Instructions (Addendum)
   IF you received an x-ray today, you will receive an invoice from [facility]. Please contact [facility] at [phone] with questions or concerns regarding your invoice.   IF you received labwork today, you will receive an invoice from [facility]. Please contact [facility] at [phone] with questions or concerns regarding your invoice.   Our billing staff will not be able to assist you with questions regarding bills from these companies.  You will be contacted with the lab results as soon as they are available. The fastest way to get your results is to activate your My Chart account. Instructions are located on the last page of this paperwork. If you have not heard from us regarding the results in 2 weeks, please contact this office.      Bronchospasm, Adult Bronchospasm is a tightening of the airways going into the lungs. During an episode, it may be harder to breathe. You may cough, and you may make a whistling sound when you breathe (wheeze). This condition often affects people with asthma. What are the causes? This condition is caused by swelling and irritation in the airways. It can be triggered by:  An infection (common).  Seasonal allergies.  An allergic reaction.  Exercise.  Irritants. These include pollution, cigarette smoke, strong odors, aerosol sprays, and paint fumes.  Weather changes. Winds increase molds and pollens in the air. Cold air may cause swelling.  Stress and emotional upset.  What are the signs or symptoms? Symptoms of this condition include:  Wheezing. If the episode was triggered by an allergy, wheezing may start right away or hours later.  Nighttime coughing.  Frequent or severe coughing with a simple cold.  Chest tightness.  Shortness of breath.  Decreased ability to exercise.  How is this diagnosed? This condition is usually diagnosed with a review of your medical history and a physical exam. Tests, such as lung  function tests, are sometimes done to look for other conditions. The need for a chest X-ray depends on where the wheezing occurs and whether it is the first time you have wheezed. How is this treated? This condition may be treated with:  Inhaled medicines. These open up the airways and help you breathe. They can be taken with an inhaler or a nebulizer device.  Corticosteroid medicines. These may be given for severe bronchospasm, usually when it is associated with asthma.  Avoiding triggers, such as irritants, infection, or allergies.  Follow these instructions at home: Medicines  Take over-the-counter and prescription medicines only as told by your health care provider.  If you need to use an inhaler or nebulizer to take your medicine, ask your health care provider to explain how to use it correctly. If you were given a spacer, always use it with your inhaler. Lifestyle  Reduce the number of triggers in your home. To do this: ? Change your heating and air conditioning filter at least once a month. ? Limit your use of fireplaces and wood stoves. ? Do not smoke. Do not allow smoking in your home. ? Avoid using perfumes and fragrances. ? Get rid of pests, such as roaches and mice, and their droppings. ? Remove any mold from your home. ? Keep your house clean and dust free. Use unscented cleaning products. ? Replace carpet with wood, tile, or vinyl flooring. Carpet can trap dander and dust. ? Use allergy-proof pillows, mattress covers, and box spring covers. ? Wash bed sheets and blankets every week in hot water. Dry them in   a dryer. ? Use blankets that are made of polyester or cotton. ? Wash your hands often. ? Do not allow pets in your bedroom.  Avoid breathing in cold air when you exercise. General instructions  Have a plan for seeking medical care. Know when to call your health care provider and local emergency services, and where to get emergency care.  Stay up to date on your  immunizations.  When you have an episode of bronchospasm, stay calm. Try to relax and breathe more slowly.  If you have asthma, make sure you have an asthma action plan.  Keep all follow-up visits as told by your health care provider. This is important. Contact a health care provider if:  You have muscle aches.  You have chest pain.  The mucus that you cough up (sputum) changes from clear or white to yellow, green, gray, or bloody.  You have a fever.  Your sputum gets thicker. Get help right away if:  Your wheezing and coughing get worse, even after you take your prescribed medicines.  It gets even harder to breathe.  You develop severe chest pain. Summary  Bronchospasm is a tightening of the airways going into the lungs.  During an episode of bronchospasm, you may have a harder time breathing. You may cough and make a whistling sound when you breathe (wheeze).  Avoid exposure to triggers such as smoke, dust, mold, animal dander, and fragrances.  When you have an episode of bronchospasm, stay calm. Try to relax and breathe more slowly. This information is not intended to replace advice given to you by your health care provider. Make sure you discuss any questions you have with your health care provider. Document Released: 03/18/2003 Document Revised: 03/11/2016 Document Reviewed: 03/11/2016 Elsevier Interactive Patient Education  2017 [employer]  

## 2017-10-17 NOTE — Progress Notes (Signed)
Subjective:    Patient ID: Jill Ortiz, female    DOB: 04-22-1990, 27 y.o.   MRN: [id number]  10/17/2017  Cough (chronic cough x 1 weeks with some wheezing ); Chest Congestion (x 1 week ); and Results    HPI This 27 y.o. female presents for three month follow-up and for acute visit:  Cough and congestion:  Onset ten days ago.  Mother in law with pneumonia and asthma exacerbation; then father in law also got pneumonia.  Went on vacation with them.  Wheezing a lot.  No fever; night sweats have resolved; chills have improved.  No sore htroat; no rhinorrhea; no nasal congestion; green sptuum in morning; really green in morning.  Taking dayquil and nyquil.  If not at work, does not take anything.  Hoarseness.  No tobacco.  Asthma as a child.  Admitted for mycoplasma infection is a very young child.  Required [facility] admission at that time.  ASCUS HPV NEGATIVE: pap smear results from visit on 08/19/17.  Discussed results with provider at [place].  Vitamin B12 deficiency:  Vitamin B12 level 190 at recent visit.  Patient takes a daily B complex supplement.  Patient is status post gastric bypass.  Iron levels and vitamin D levels were normal.   BP Readings from Last 3 Encounters:  10/17/17 118/70  08/19/17 110/80  07/25/17 122/72   Wt Readings from Last 3 Encounters:  10/17/17 191 lb (86.6 kg)  08/19/17 197 lb 9.6 oz (89.6 kg)  07/25/17 201 lb (91.2 kg)   Immunization History  Administered Date(s) Administered  . Influenza Split 12/28/2014  . Influenza, Seasonal, Injecte, Preservative Fre 01/30/2005, 01/09/2007  . Tdap 03/29/2008, 08/19/2017    Review of Systems  Constitutional: Positive for fatigue. Negative for activity change, appetite change, chills, diaphoresis, fever and unexpected weight change.  HENT: Positive for voice change. Negative for congestion, dental problem, drooling, ear discharge, ear pain, facial swelling, hearing loss, mouth sores, nosebleeds,  postnasal drip, rhinorrhea, sinus pressure, sinus pain, sneezing, sore throat, tinnitus and trouble swallowing.   Eyes: Negative for photophobia, pain, discharge, redness, itching and visual disturbance.  Respiratory: Positive for cough and wheezing. Negative for apnea, choking, chest tightness, shortness of breath and stridor.   Cardiovascular: Negative for chest pain, palpitations and leg swelling.  Gastrointestinal: Negative for abdominal distention, abdominal pain, anal bleeding, blood in stool, constipation, diarrhea, nausea, rectal pain and vomiting.  Endocrine: Negative for cold intolerance, heat intolerance, polydipsia, polyphagia and polyuria.  Genitourinary: Negative for decreased urine volume, difficulty urinating, dyspareunia, dysuria, enuresis, flank pain, frequency, genital sores, hematuria, menstrual problem, pelvic pain, urgency, vaginal bleeding, vaginal discharge and vaginal pain.  Musculoskeletal: Negative for arthralgias, back pain, gait problem, joint swelling, myalgias, neck pain and neck stiffness.  Skin: Negative for color change, pallor, rash and wound.  Allergic/Immunologic: Negative for environmental allergies, food allergies and immunocompromised state.  Neurological: Negative for dizziness, tremors, seizures, syncope, facial asymmetry, speech difficulty, weakness, light-headedness, numbness and headaches.  Hematological: Negative for adenopathy. Does not bruise/bleed easily.  Psychiatric/Behavioral: Negative for agitation, behavioral problems, confusion, decreased concentration, dysphoric mood, hallucinations, self-injury, sleep disturbance and suicidal ideas. The patient is not nervous/anxious and is not hyperactive.     Past Medical History:  Diagnosis Date  . Anxiety   . Glucose intolerance (impaired glucose tolerance)   . Seasonal allergies    Past Surgical History:  Procedure Laterality Date  . Endocrinology consultation  09/26/2012   TSH slightly elevated,  elevated cortisol.  Repeat labs normal.  Elevated cortisol due to OCPs.  No treatment warranted.  Marland Kitchen GASTRIC BYPASS  10/19/2012   [facility].  Marland Kitchen POLYSOMNOGRAPHY  08/27/2012   no OSA.  Pre-operative clearance for gastric bypass.  . TONSILLECTOMY AND ADENOIDECTOMY     No Known Allergies Current Outpatient Medications on File Prior to Visit  Medication Sig Dispense Refill  . ALPRAZolam (XANAX) 0.5 MG tablet Take 1 tablet (0.5 mg total) by mouth at bedtime as needed for sleep. 30 tablet 3  . b complex vitamins tablet Take 1 tablet by mouth daily.    Marland Kitchen BIOTIN PO Take 500 mcg by mouth daily.    . Calcium-Vitamin D-Vitamin K 025-427-06 MG-UNT-MCG CHEW Chew by mouth 2 (two) times daily.    . Cyanocobalamin (VITAMIN B12 PO) Take 2,500 mcg by mouth daily.    Marland Kitchen FLUoxetine (PROZAC) 20 MG tablet Take 1 tablet (20 mg total) by mouth daily. 90 tablet 3  . fluticasone (FLONASE) 50 MCG/ACT nasal spray Place 2 sprays into both nostrils daily. 16 g 11  . Multiple Vitamin (MULTIVITAMIN) tablet Take 1 tablet by mouth daily.    Cyndie Chime Estradiol-Fe [facility] FE,WYMZYA Orrin Brigham) 0.4-35 MG-MCG tablet Chew 1 tablet by mouth daily. 3 Package 4  . phentermine (ADIPEX-P) 37.5 MG tablet Take 1 tablet (37.5 mg total) by mouth daily before breakfast. 30 tablet 5  . topiramate (TOPAMAX) 25 MG capsule Take 1 capsule (25 mg total) by mouth 2 (two) times daily. 180 capsule 3   No current facility-administered medications on file prior to visit.    Social History   Socioeconomic History  . Marital status: Single    Spouse name: Not on file  . Number of children: Not on file  . Years of education: Not on file  . Highest education level: Not on file  Occupational History  . Occupation: CMA    Employer: [employer]  . Financial resource strain: Not on file  . Food insecurity:    Worry: Not on file    Inability: Not on file  . Transportation needs:    Medical: Not on file    Non-medical: Not  on file  Tobacco Use  . Smoking status: Never Smoker  . Smokeless tobacco: Never Used  Substance and Sexual Activity  . Alcohol use: Yes  . Drug use: No  . Sexual activity: Yes    Birth control/protection: IUD    Comment: mirena  Lifestyle  . Physical activity:    Days per week: Not on file    Minutes per session: Not on file  . Stress: Not on file  Relationships  . Social connections:    Talks on phone: Not on file    Gets together: Not on file    Attends religious service: Not on file    Active member of club or organization: Not on file    Attends meetings of clubs or organizations: Not on file    Relationship status: Not on file  . Intimate partner violence:    Fear of current or ex partner: Not on file    Emotionally abused: Not on file    Physically abused: Not on file    Forced sexual activity: Not on file  Other Topics Concern  . Not on file  Social History Narrative   Marital status: married      Children; none      Lives: with husband      Employment: [employer]; working with [employer]or, [employer].  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      Education: applying to nursing school.      Tobacco: none      Alcohol: socially weekends      Drugs; none      Exercise:  3 times per week.  Jogging.        Seatbelt: 100%   Family History  Problem Relation Age of Onset  . Diabetes Mother   . Hyperlipidemia Mother   . Hypertension Mother   . COPD Mother   . Heart disease Mother        heart arrhythmia; V. Tach.  CHF  . Hyperlipidemia Father   . Diabetes Maternal Grandmother   . Diabetes Paternal Grandmother        Objective:    BP 118/70   Pulse (!) 122   Temp 98.8 F (37.1 C) (Oral)   Resp 16   Ht 5' 6.54" (1.69 m)   Wt 191 lb (86.6 kg)   LMP 10/10/2017 (Approximate)   SpO2 99%   BMI 30.33 kg/m  Physical Exam  Constitutional: She is oriented to person, place, and time. She appears well-developed and well-nourished. No distress.  HENT:  Head: Normocephalic and atraumatic.  Right  Ear: Hearing, tympanic membrane, external ear and ear canal normal.  Left Ear: Hearing, tympanic membrane, external ear and ear canal normal.  Nose: Nose normal.  Mouth/Throat: Uvula is midline, oropharynx is clear and moist and mucous membranes are normal. No oropharyngeal exudate, posterior oropharyngeal edema, posterior oropharyngeal erythema or tonsillar abscesses.  Eyes: Pupils are equal, round, and reactive to light. Conjunctivae are normal.  Neck: Normal range of motion. Neck supple.  Cardiovascular: Normal rate, regular rhythm and normal heart sounds. Exam reveals no gallop and no friction rub.  No murmur heard. Pulmonary/Chest: Effort normal. No accessory muscle usage or stridor. No tachypnea. No respiratory distress. She has no decreased breath sounds. She has wheezes in the right upper field, the right middle field, the right lower field, the left upper field, the left middle field and the left lower field. She has no rhonchi. She has no rales.  Good air movement throughout yet diffuse wheezing.  Lymphadenopathy:    She has no cervical adenopathy.  Neurological: She is alert and oriented to person, place, and time.  Skin: She is not diaphoretic.  Psychiatric: She has a normal mood and affect. Her behavior is normal.  Nursing note and vitals reviewed.  No results found. Depression screen [facility] 2/9 10/17/2017 08/19/2017 07/25/2017 04/25/2017 06/29/2016  Decreased Interest 0 0 0 0 0  Down, Depressed, Hopeless 0 0 0 0 0  PHQ - 2 Score 0 0 0 0 0   Fall Risk  10/17/2017 08/19/2017 07/25/2017 04/25/2017 06/29/2016  Falls in the past year? No No No No No   DuoNeb administered during visit.  Vitamin B12 1000 mcg IM administered.     Assessment & Plan:   1. Lower respiratory infection   2. Bronchospasm   3. Vitamin B12 deficiency   4. ASCUS of cervix with negative high risk HPV   5. S/P gastric bypass   6. Herpes labialis   7. Vaginal candidiasis     Lower respiratory infection with  acute bronchospasm: New.  Obtain chest x-ray to rule out infiltrate.  Status post DuoNeb in office with improved air movement yet persistent wheezing.  Treat with azithromycin as well as prednisone and albuterol therapy.  Recommend using albuterol HFA scheduled 4 times daily for the next week and  then as needed.  Return to clinic for respiratory distress, shortness of breath, acute decline.  Vitamin B12 deficiency: New.  Status post B12 injection in office.  Recommend weekly B12 injections for 4 weeks followed by once monthly vitamin B12 injections.  Prescription provided to patient.  Status post gastric bypass is etiology to B12 deficiency.  ASCUS HPV negative: New on Pap smear in May 2019.  Per current guidelines, repeat Pap smear recommended in 3 years.  No further work-up at this time due to HPV negative status.  Patient expresses understanding of current guidelines.  Orders Placed This Encounter  Procedures  . DG Chest 2 View    Standing Status:   Future    Number of Occurrences:   1    Standing Expiration Date:   10/17/2018    Order Specific Question:   Reason for Exam (SYMPTOM  OR DIAGNOSIS REQUIRED)    Answer:   cough, wheezing    Order Specific Question:   Is the patient pregnant?    Answer:   No    Order Specific Question:   Preferred imaging location?    Answer:   External   Meds ordered this encounter  Medications  . albuterol (PROVENTIL) (2.5 MG/3ML) 0.083% nebulizer solution 2.5 mg  . ipratropium (ATROVENT) nebulizer solution 0.5 mg  . cyanocobalamin ((VITAMIN B-12)) injection 1,000 mcg  . cyanocobalamin (,VITAMIN B-12,) 1000 MCG/ML injection    Sig: Inject 1 mL (1,000 mcg total) into the muscle every 30 (thirty) days.    Dispense:  10 mL    Refill:  3  . predniSONE (DELTASONE) 20 MG tablet    Sig: Take 3 PO QAM x 1 day, 2 PO QAM x 5 days, 1 PO QAM x 5 days    Dispense:  18 tablet    Refill:  0  . albuterol (PROVENTIL HFA;VENTOLIN HFA) 108 (90 Base) MCG/ACT inhaler     Sig: Inhale 2 puffs into the lungs every 4 (four) hours as needed for wheezing or shortness of breath (cough, shortness of breath or wheezing.).    Dispense:  1 Inhaler    Refill:  1  . azithromycin (ZITHROMAX) 250 MG tablet    Sig: Take 2 tabs PO x 1 dose, then 1 tab PO QD x 4 days    Dispense:  6 tablet    Refill:  0  . fluconazole (DIFLUCAN) 150 MG tablet    Sig: Take 1 tablet (150 mg total) by mouth once for 1 dose. Repeat if needed    Dispense:  2 tablet    Refill:  0  . valACYclovir (VALTREX) 1000 MG tablet    Sig: Take 2 tablets (2,000 mg total) by mouth 2 (two) times daily. For one day.    Dispense:  30 tablet    Refill:  1  . Insulin Syringe-Needle U-100 28G X 1/2" 0.3 ML MISC    Sig: 1 Dose by Does not apply route every 30 (thirty) days.    Dispense:  90 each    Refill:  0    Return if symptoms worsen or fail to improve.   Yarnell Kozloski Elayne Guerin, M.D. Primary Care at [facility] [address] [place], [place]  [address] [phone] phone [phone] fax

## 2017-10-18 DIAGNOSIS — B001 Herpesviral vesicular dermatitis: Secondary | ICD-10-CM | POA: Insufficient documentation

## 2017-10-19 ENCOUNTER — Ambulatory Visit: Payer: No Typology Code available for payment source | Admitting: Family Medicine

## 2018-04-28 DIAGNOSIS — E669 Obesity, unspecified: Secondary | ICD-10-CM | POA: Diagnosis not present

## 2018-04-28 DIAGNOSIS — F411 Generalized anxiety disorder: Secondary | ICD-10-CM | POA: Diagnosis not present

## 2018-04-28 DIAGNOSIS — E538 Deficiency of other specified B group vitamins: Secondary | ICD-10-CM | POA: Diagnosis not present

## 2018-04-28 DIAGNOSIS — Z9884 Bariatric surgery status: Secondary | ICD-10-CM | POA: Diagnosis not present

## 2018-11-01 DIAGNOSIS — E669 Obesity, unspecified: Secondary | ICD-10-CM | POA: Diagnosis not present

## 2018-11-01 DIAGNOSIS — Z131 Encounter for screening for diabetes mellitus: Secondary | ICD-10-CM | POA: Diagnosis not present

## 2018-11-01 DIAGNOSIS — Z0000 Encounter for general adult medical examination without abnormal findings: Secondary | ICD-10-CM | POA: Diagnosis not present

## 2018-11-01 DIAGNOSIS — Z1159 Encounter for screening for other viral diseases: Secondary | ICD-10-CM | POA: Diagnosis not present

## 2018-11-01 DIAGNOSIS — Z9884 Bariatric surgery status: Secondary | ICD-10-CM | POA: Diagnosis not present

## 2018-11-01 DIAGNOSIS — Z13220 Encounter for screening for lipoid disorders: Secondary | ICD-10-CM | POA: Diagnosis not present

## 2019-01-17 DIAGNOSIS — M25561 Pain in right knee: Secondary | ICD-10-CM | POA: Diagnosis not present

## 2019-01-17 DIAGNOSIS — S0990XA Unspecified injury of head, initial encounter: Secondary | ICD-10-CM | POA: Diagnosis not present

## 2019-01-17 DIAGNOSIS — S0181XA Laceration without foreign body of other part of head, initial encounter: Secondary | ICD-10-CM | POA: Diagnosis not present

## 2019-01-17 DIAGNOSIS — S80211A Abrasion, right knee, initial encounter: Secondary | ICD-10-CM | POA: Diagnosis not present

## 2019-01-17 DIAGNOSIS — Z23 Encounter for immunization: Secondary | ICD-10-CM | POA: Diagnosis not present

## 2019-01-17 DIAGNOSIS — S098XXA Other specified injuries of head, initial encounter: Secondary | ICD-10-CM | POA: Diagnosis not present

## 2019-01-18 DIAGNOSIS — M25561 Pain in right knee: Secondary | ICD-10-CM | POA: Diagnosis not present

## 2019-01-18 DIAGNOSIS — Z23 Encounter for immunization: Secondary | ICD-10-CM | POA: Diagnosis not present

## 2019-01-18 DIAGNOSIS — S0181XA Laceration without foreign body of other part of head, initial encounter: Secondary | ICD-10-CM | POA: Diagnosis not present

## 2019-01-18 DIAGNOSIS — S80211A Abrasion, right knee, initial encounter: Secondary | ICD-10-CM | POA: Diagnosis not present

## 2019-01-18 DIAGNOSIS — S0990XA Unspecified injury of head, initial encounter: Secondary | ICD-10-CM | POA: Diagnosis not present

## 2019-05-14 IMAGING — DX DG CHEST 2V
2 series · 2 of 2 positions shown · non-contrast
Comparison: None.

CLINICAL DATA: Acute cough and wheezing.

EXAM:
CHEST - 2 VIEW

[chest pa]
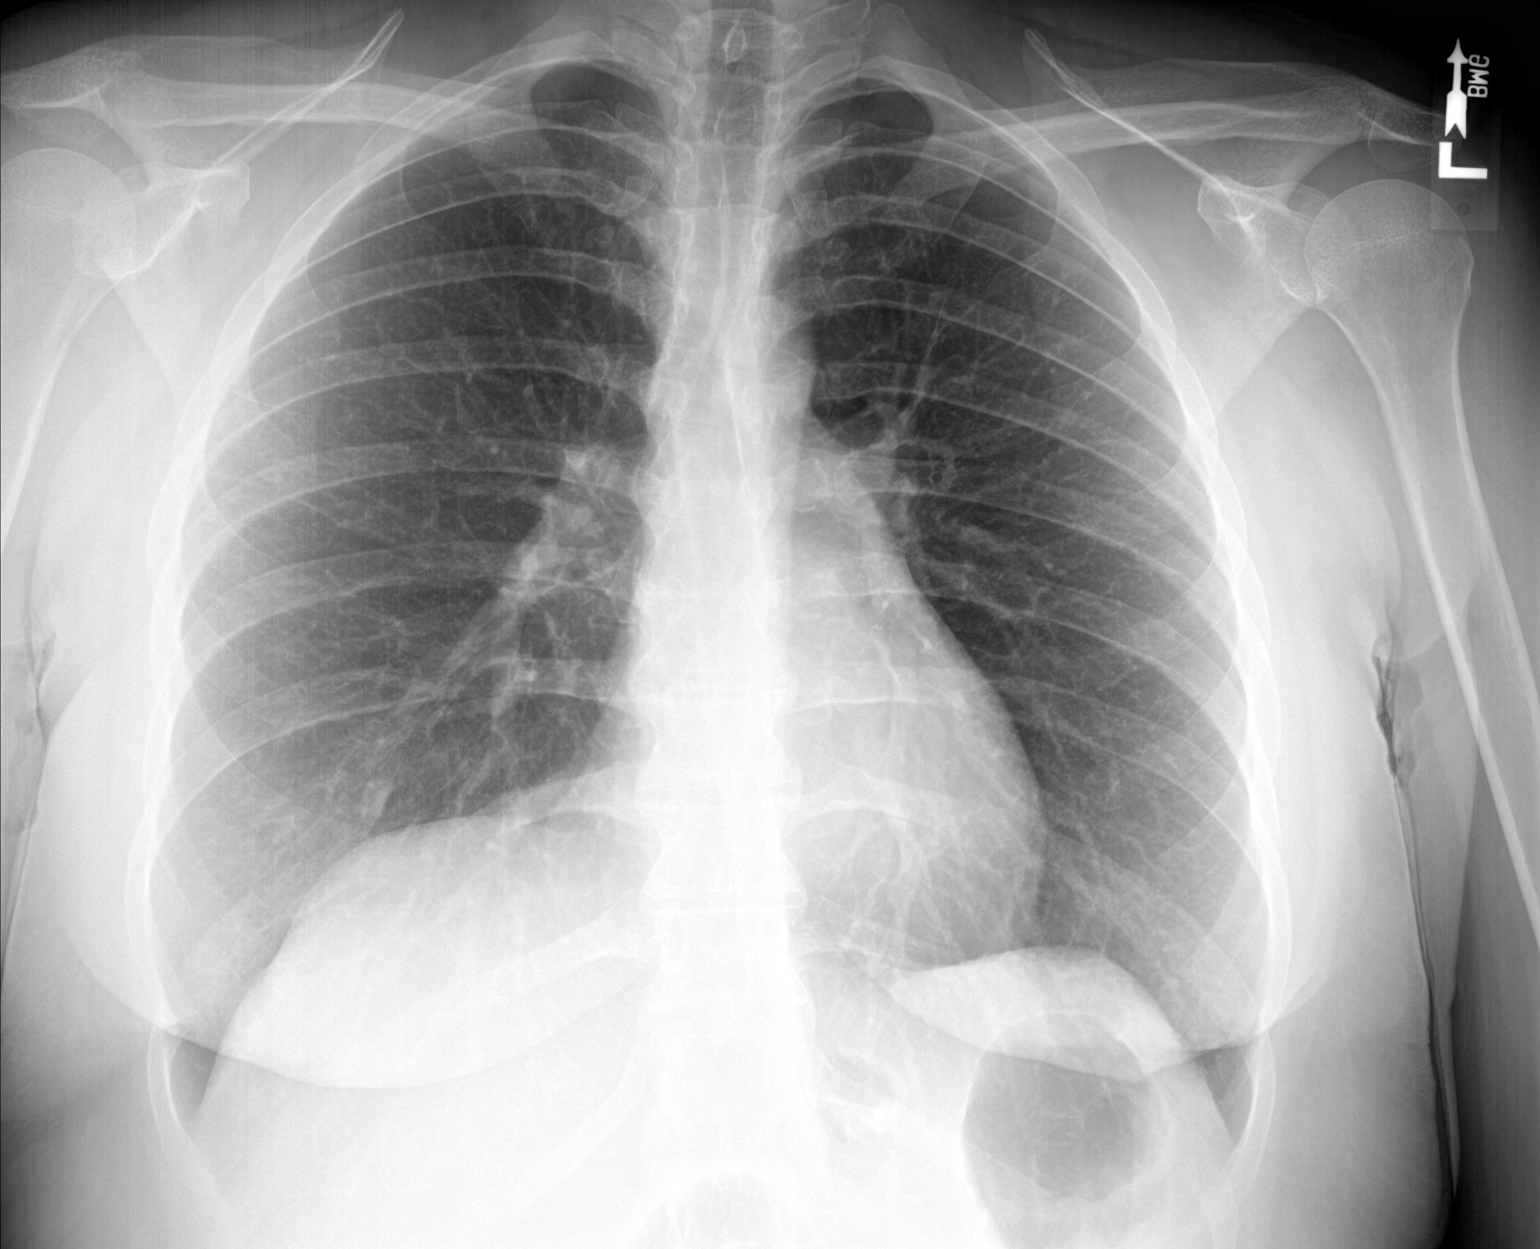

[chest lat]
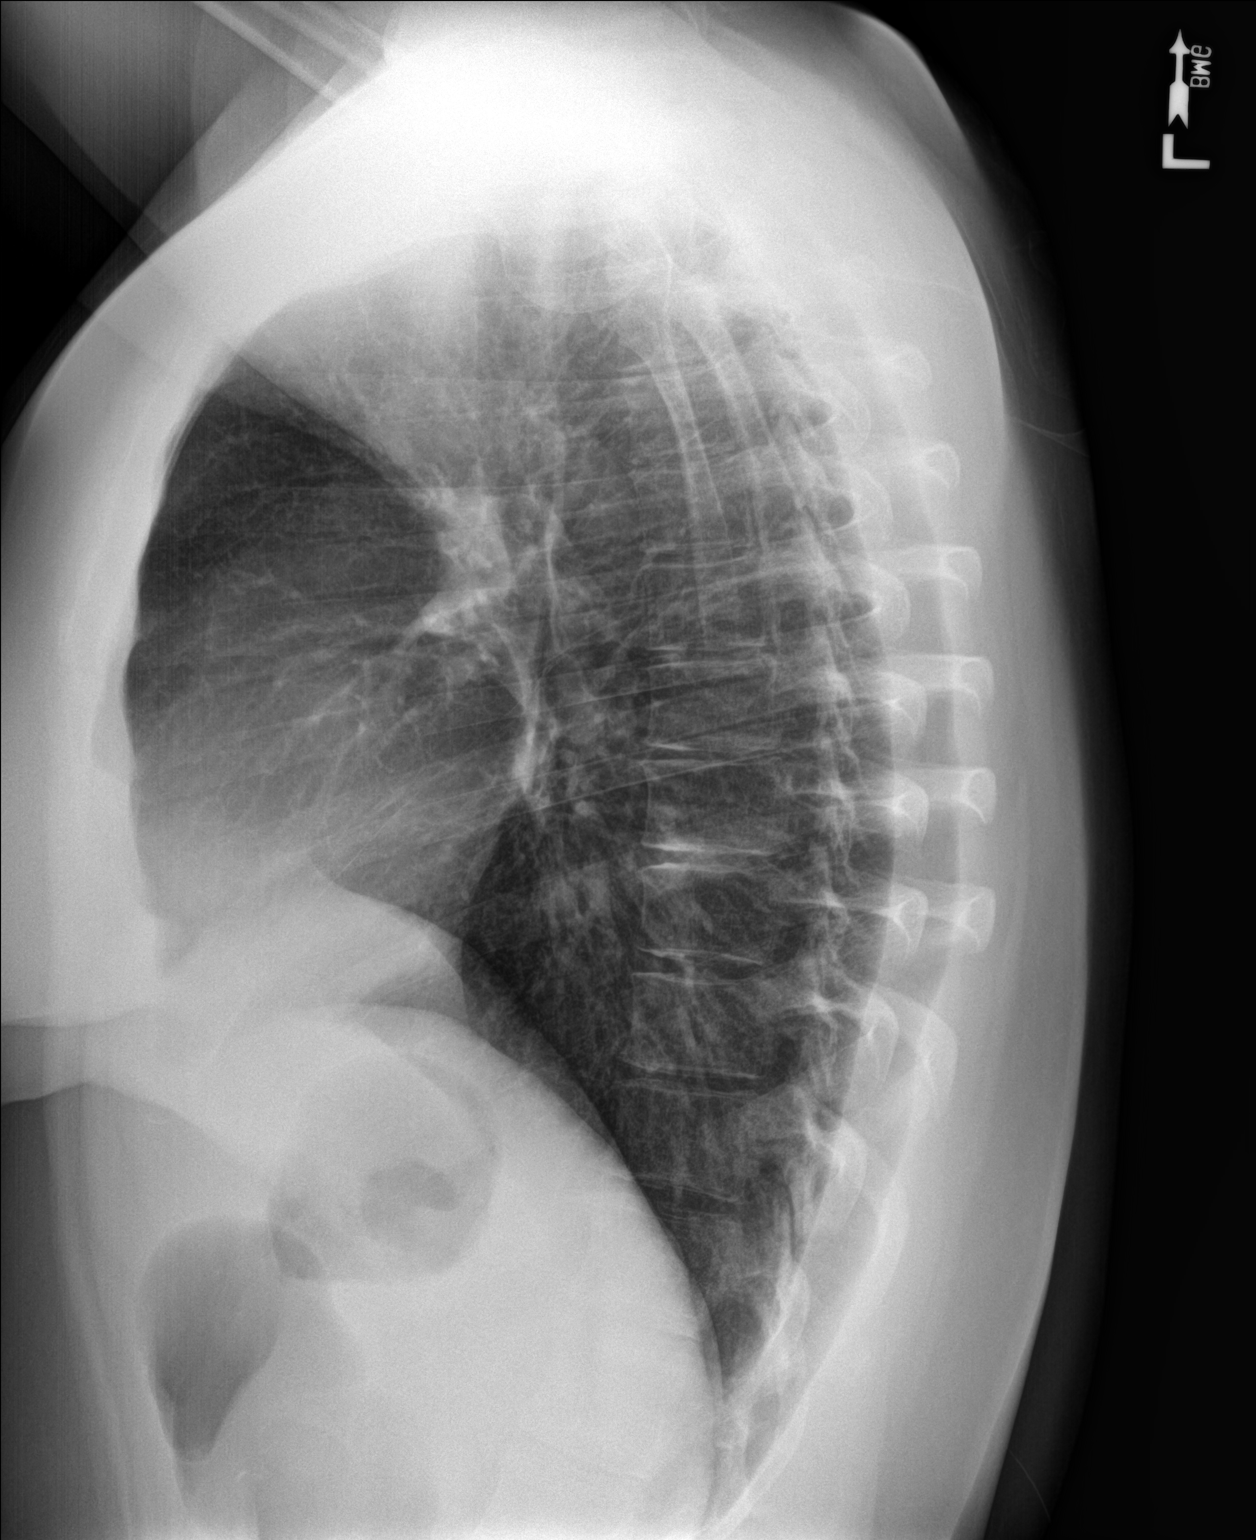

[2 of 2 positions shown; findings below may reference images not displayed]

FINDINGS: Mild RIGHT middle lobe atelectasis/scarring noted.

Mild peribronchial thickening identified.

Cardiomediastinal silhouette is otherwise unremarkable.

There is no evidence of focal airspace disease, pulmonary edema,
suspicious pulmonary nodule/mass, pleural effusion, or pneumothorax.

No acute bony abnormalities are identified.
IMPRESSION: Mild RIGHT middle lobe atelectasis/scarring and mild peribronchial
thickening which may be chronic.

## 2019-05-25 DIAGNOSIS — E663 Overweight: Secondary | ICD-10-CM | POA: Diagnosis not present

## 2019-05-25 DIAGNOSIS — Z9884 Bariatric surgery status: Secondary | ICD-10-CM | POA: Diagnosis not present

## 2019-05-25 DIAGNOSIS — F411 Generalized anxiety disorder: Secondary | ICD-10-CM | POA: Diagnosis not present

## 2019-05-25 DIAGNOSIS — E538 Deficiency of other specified B group vitamins: Secondary | ICD-10-CM | POA: Diagnosis not present

## 2019-11-28 ENCOUNTER — Other Ambulatory Visit: Payer: Self-pay | Admitting: Family Medicine

## 2019-11-28 DIAGNOSIS — Z131 Encounter for screening for diabetes mellitus: Secondary | ICD-10-CM | POA: Diagnosis not present

## 2019-11-28 DIAGNOSIS — J301 Allergic rhinitis due to pollen: Secondary | ICD-10-CM | POA: Diagnosis not present

## 2019-11-28 DIAGNOSIS — E538 Deficiency of other specified B group vitamins: Secondary | ICD-10-CM | POA: Diagnosis not present

## 2019-11-28 DIAGNOSIS — F411 Generalized anxiety disorder: Secondary | ICD-10-CM | POA: Diagnosis not present

## 2019-11-28 DIAGNOSIS — Z13220 Encounter for screening for lipoid disorders: Secondary | ICD-10-CM | POA: Diagnosis not present

## 2019-11-28 DIAGNOSIS — Z0000 Encounter for general adult medical examination without abnormal findings: Secondary | ICD-10-CM | POA: Diagnosis not present

## 2019-11-28 DIAGNOSIS — K219 Gastro-esophageal reflux disease without esophagitis: Secondary | ICD-10-CM | POA: Diagnosis not present

## 2019-11-28 DIAGNOSIS — Z9884 Bariatric surgery status: Secondary | ICD-10-CM | POA: Diagnosis not present

## 2020-06-11 ENCOUNTER — Other Ambulatory Visit: Payer: Self-pay | Admitting: Family Medicine

## 2020-07-01 ENCOUNTER — Other Ambulatory Visit (HOSPITAL_COMMUNITY): Payer: Self-pay

## 2020-07-22 ENCOUNTER — Other Ambulatory Visit: Payer: Self-pay

## 2020-07-22 MED ORDER — FAMOTIDINE 20 MG PO TABS
ORAL_TABLET | ORAL | 2 refills | Status: AC
  Filled 2020-07-22: qty 180, 90d supply, fill #0

## 2020-07-22 MED FILL — Phentermine HCl Cap 15 MG: ORAL | 30 days supply | Qty: 30 | Fill #0 | Status: AC

## 2020-07-22 MED FILL — Cyanocobalamin Inj 1000 MCG/ML: INTRAMUSCULAR | 90 days supply | Qty: 3 | Fill #0 | Status: AC

## 2020-07-28 ENCOUNTER — Other Ambulatory Visit: Payer: Self-pay

## 2020-07-28 DIAGNOSIS — Z9884 Bariatric surgery status: Secondary | ICD-10-CM | POA: Diagnosis not present

## 2020-07-28 DIAGNOSIS — N979 Female infertility, unspecified: Secondary | ICD-10-CM | POA: Diagnosis not present

## 2020-07-28 DIAGNOSIS — E538 Deficiency of other specified B group vitamins: Secondary | ICD-10-CM | POA: Diagnosis not present

## 2020-07-28 MED ORDER — PHENTERMINE HCL 37.5 MG PO TABS
ORAL_TABLET | ORAL | 3 refills | Status: DC
  Filled 2020-07-28 – 2020-08-11 (×3): qty 30, 30d supply, fill #0
  Filled 2020-09-04 – 2020-09-09 (×2): qty 30, 30d supply, fill #1
  Filled 2020-10-15: qty 30, 30d supply, fill #2
  Filled 2020-11-18: qty 30, 30d supply, fill #3

## 2020-08-11 ENCOUNTER — Other Ambulatory Visit: Payer: Self-pay

## 2020-09-04 ENCOUNTER — Other Ambulatory Visit: Payer: Self-pay

## 2020-09-09 ENCOUNTER — Other Ambulatory Visit: Payer: Self-pay

## 2020-10-15 ENCOUNTER — Other Ambulatory Visit: Payer: Self-pay

## 2020-11-18 ENCOUNTER — Other Ambulatory Visit: Payer: Self-pay

## 2020-11-26 ENCOUNTER — Other Ambulatory Visit: Payer: Self-pay

## 2020-11-26 MED ORDER — VALACYCLOVIR HCL 1 G PO TABS
ORAL_TABLET | ORAL | 3 refills | Status: DC
  Filled 2020-11-26: qty 30, 8d supply, fill #0

## 2020-12-08 ENCOUNTER — Other Ambulatory Visit: Payer: Self-pay

## 2021-02-03 ENCOUNTER — Other Ambulatory Visit: Payer: Self-pay

## 2021-02-03 MED ORDER — CYANOCOBALAMIN 1000 MCG/ML IJ SOLN
INTRAMUSCULAR | 1 refills | Status: AC
  Filled 2021-02-03: qty 3, 90d supply, fill #0
  Filled 2021-05-11: qty 3, 84d supply, fill #1

## 2021-02-03 MED ORDER — VALACYCLOVIR HCL 1 G PO TABS
ORAL_TABLET | ORAL | 5 refills | Status: AC
  Filled 2021-02-03: qty 30, 10d supply, fill #0
  Filled 2021-04-09: qty 30, 10d supply, fill #1
  Filled 2021-05-11: qty 30, 8d supply, fill #2
  Filled 2021-09-23 – 2021-10-19 (×2): qty 30, 8d supply, fill #3

## 2021-02-03 MED ORDER — PHENTERMINE HCL 37.5 MG PO TABS
ORAL_TABLET | ORAL | 3 refills | Status: DC
  Filled 2021-02-03: qty 30, 30d supply, fill #0
  Filled 2021-03-08: qty 30, 30d supply, fill #1
  Filled 2021-04-09: qty 30, 30d supply, fill #2
  Filled 2021-05-11: qty 30, 30d supply, fill #3

## 2021-02-03 MED ORDER — FAMOTIDINE 20 MG PO TABS
ORAL_TABLET | ORAL | 1 refills | Status: DC
  Filled 2021-02-03: qty 180, 90d supply, fill #0
  Filled 2021-05-11: qty 180, 90d supply, fill #1

## 2021-03-09 ENCOUNTER — Other Ambulatory Visit: Payer: Self-pay

## 2021-04-09 ENCOUNTER — Other Ambulatory Visit: Payer: Self-pay

## 2021-04-15 DIAGNOSIS — H5213 Myopia, bilateral: Secondary | ICD-10-CM | POA: Diagnosis not present

## 2021-05-11 ENCOUNTER — Other Ambulatory Visit: Payer: Self-pay

## 2021-06-12 ENCOUNTER — Other Ambulatory Visit: Payer: Self-pay

## 2021-06-12 MED ORDER — FLUTICASONE PROPIONATE 50 MCG/ACT NA SUSP
NASAL | 0 refills | Status: DC
  Filled 2021-06-12: qty 48, 90d supply, fill #0

## 2021-06-12 MED ORDER — PHENTERMINE HCL 37.5 MG PO TABS
ORAL_TABLET | ORAL | 2 refills | Status: AC
  Filled 2021-06-12: qty 30, 30d supply, fill #0
  Filled 2021-07-13: qty 30, 30d supply, fill #1
  Filled 2021-08-26: qty 30, 30d supply, fill #2

## 2021-06-24 ENCOUNTER — Encounter: Payer: Self-pay | Admitting: Certified Nurse Midwife

## 2021-06-29 ENCOUNTER — Encounter: Payer: Self-pay | Admitting: Certified Nurse Midwife

## 2021-06-29 ENCOUNTER — Ambulatory Visit: Payer: 59 | Admitting: Certified Nurse Midwife

## 2021-06-29 ENCOUNTER — Other Ambulatory Visit (HOSPITAL_COMMUNITY)
Admission: RE | Admit: 2021-06-29 | Discharge: 2021-06-29 | Disposition: A | Discharge status: Home / Self Care | Payer: 59 | Source: Ambulatory Visit | Attending: Certified Nurse Midwife | Admitting: Certified Nurse Midwife

## 2021-06-29 VITALS — BP 121/84 | HR 86 | Ht 66.0 in | Wt 216.6 lb

## 2021-06-29 DIAGNOSIS — Z124 Encounter for screening for malignant neoplasm of cervix: Secondary | ICD-10-CM | POA: Diagnosis not present

## 2021-06-29 DIAGNOSIS — Z01419 Encounter for gynecological examination (general) (routine) without abnormal findings: Secondary | ICD-10-CM

## 2021-06-29 DIAGNOSIS — N979 Female infertility, unspecified: Secondary | ICD-10-CM

## 2021-06-29 NOTE — Progress Notes (Signed)
? ? ?GYNECOLOGY ANNUAL PREVENTATIVE CARE ENCOUNTER NOTE ? ?History:    ? Jill Ortiz is a 31 y.o. No obstetric history on file. female here for a routine annual gynecologic exam.  Current complaints: infertility . She has never been pregnant and her husband does not have any children. She has been off birth control x 2 yrs and has used ovulation testing that shows ovulation.   Denies abnormal vaginal bleeding, discharge, pelvic pain, problems with intercourse or other gynecologic concerns.  ?  ? ?Social ?Relationship: married  ?Living: with husband ?Work: Psychologist, sport and exercise [employer] ?Exercise: 3-4 time week x 1 hr ?Smoke/Alcohol/dru g [id number] use 2-3 time wk, denies smoking, vaping, drug use.  ? ?Gynecologic History ?Patient's last menstrual period was 06/21/2021 (exact date). ?Contraception: none, has regular cycles. Has used ovulation kit which show ovulation. Stopped BC x 2 hrs.  ?Last Pap: 08/19/2017. Results were: ASCUS with negative HPV ?Last mammogram: n/a . ? ? Upstream - 06/29/21 1003   ? ?  ? Pregnancy Intention Screening  ? Does the patient want to become pregnant in the next year? Yes   ? Does the patient's partner want to become pregnant in the next year? Yes   ? Would the patient like to discuss contraceptive options today? No   ? ?  ?  ? ?  ? ?The pregnancy intention screening data noted above was reviewed. Potential methods of contraception were discussed. The patient elected to proceed with wants pregnancy. ? ?Obstetric History ?OB History  ?No obstetric history on file.  ? ? ?Past Medical History:  ?Diagnosis Date  ? Anxiety   ? Glucose intolerance (impaired glucose tolerance)   ? Seasonal allergies   ? ? ?Past Surgical History:  ?Procedure Laterality Date  ? Endocrinology consultation  09/26/2012  ? TSH slightly elevated, elevated cortisol.  Repeat labs normal.  Elevated cortisol due to OCPs.  No treatment warranted.  ? GASTRIC BYPASS  10/19/2012  ? [place].  ? POLYSOMNOGRAPHY   08/27/2012  ? no OSA.  Pre-operative clearance for gastric bypass.  ? TONSILLECTOMY AND ADENOIDECTOMY    ? ? ?Current Outpatient Medications on File Prior to Visit  ?Medication Sig Dispense Refill  ? albuterol (PROVENTIL HFA;VENTOLIN HFA) 108 (90 Base) MCG/ACT inhaler Inhale 2 puffs into the lungs every 4 (four) hours as needed for wheezing or shortness of breath (cough, shortness of breath or wheezing.). 1 Inhaler 1  ? ALPRAZolam (XANAX) 0.5 MG tablet Take 1 tablet (0.5 mg total) by mouth at bedtime as needed for sleep. 30 tablet 3  ? cyanocobalamin (,VITAMIN B-12,) 1000 MCG/ML injection Inject 1 mL (1,000 mcg total) into the muscle every 30 (thirty) days. 10 mL 3  ? cyanocobalamin (,VITAMIN B-12,) 1000 MCG/ML injection Inject 1 mL (1,000 mcg total) into the muscle monthly 3 mL 1  ? famotidine (PEPCID) 20 MG tablet TAKE ONE TABLET BY MOUTH 2 TIMES DAILY AS NEEDED FOR HEARTBURN 180 tablet 2  ? famotidine (PEPCID) 20 MG tablet Take 1 tablet (20 mg total) by mouth 2 (two) times daily as needed for Heartburn 180 tablet 1  ? fluticasone (FLONASE) 50 MCG/ACT nasal spray Place 2 sprays into both nostrils once daily 48 g 0  ? phentermine (ADIPEX-P) 37.5 MG tablet Take 1 tablet (37.5 mg total) by mouth every morning before breakfast 30 tablet 2  ? valACYclovir (VALTREX) 1000 MG tablet Take 2 tablets (2,000 mg total) by mouth 2 (two) times daily For 2 doses prn 30 tablet 5  ?  azithromycin (ZITHROMAX) 250 MG tablet Take 2 tabs PO x 1 dose, then 1 tab PO QD x 4 days 6 tablet 0  ? b complex vitamins tablet Take 1 tablet by mouth daily.    ? BIOTIN PO Take 500 mcg by mouth daily.    ? Calcium-Vitamin D-Vitamin K 858-850-27 MG-UNT-MCG CHEW Chew by mouth 2 (two) times daily.    ? Cyanocobalamin (VITAMIN B12 PO) Take 2,500 mcg by mouth daily.    ? FLUoxetine (PROZAC) 20 MG tablet Take 1 tablet (20 mg total) by mouth daily. 90 tablet 3  ? fluticasone (FLONASE) 50 MCG/ACT nasal spray Place 2 sprays into both nostrils daily. 16 g 11  ?  Insulin Syringe-Needle U-100 28G X 1/2" 0.3 ML MISC 1 Dose by Does not apply route every 30 (thirty) days. 90 each 0  ? Multiple Vitamin (MULTIVITAMIN) tablet Take 1 tablet by mouth daily.    ? Norethin-Eth Estradiol-Fe [facility] FE,WYMZYA Orrin Brigham) 0.4-35 MG-MCG tablet Chew 1 tablet by mouth daily. 3 Package 4  ? phentermine (ADIPEX-P) 37.5 MG tablet Take 1 tablet (37.5 mg total) by mouth daily before breakfast. 30 tablet 5  ? phentermine 15 MG capsule TAKE 1 CAPSULE (15 MG TOTAL) BY MOUTH EVERY MORNING BEFORE BREAKFAST FOR 30 DAYS 30 capsule 1  ? predniSONE (DELTASONE) 20 MG tablet Take 3 PO QAM x 1 day, 2 PO QAM x 5 days, 1 PO QAM x 5 days 18 tablet 0  ? topiramate (TOPAMAX) 25 MG capsule Take 1 capsule (25 mg total) by mouth 2 (two) times daily. 180 capsule 3  ? valACYclovir (VALTREX) 1000 MG tablet Take 2 tablets (2,000 mg total) by mouth 2 (two) times daily. For one day. 30 tablet 1  ? valACYclovir (VALTREX) 1000 MG tablet TAKE 2 TABLETS BY MOUTH TWICE A DAY FOR 2 DOSES AS NEEDED 30 tablet 3  ? ?Current Facility-Administered Medications on File Prior to Visit  ?Medication Dose Route Frequency Provider Last Rate Last Admin  ? cyanocobalamin ((VITAMIN B-12)) injection 1,000 mcg  1,000 mcg Intramuscular Q30 days Wardell Honour, MD   1,000 mcg at 10/17/17 1210  ? ? ?Not on File ? ?Social History:  reports that she has never smoked. She has never used smokeless tobacco. She reports current alcohol use. She reports that she does not use drugs. ? ?Family History  ?Problem Relation Age of Onset  ? Diabetes Mother   ? Hyperlipidemia Mother   ? Hypertension Mother   ? COPD Mother   ? Heart disease Mother   ?     heart arrhythmia; V. Tach.  CHF  ? Hyperlipidemia Father   ? Diabetes Maternal Grandmother   ? Diabetes Paternal Grandmother   ? ? ?The following portions of the patient's history were reviewed and updated as appropriate: allergies, current medications, past family history, past medical history, past  social history, past surgical history and problem list. ? ?Review of Systems ?Pertinent items noted in HPI and remainder of comprehensive ROS otherwise negative. ? ?Physical Exam:  ?BP 121/84   Pulse 86   Ht _0  (1.676 m)   Wt 216 lb 9.6 oz (98.2 kg)   LMP 06/21/2021 (Exact Date)   BMI 34.96 kg/m?  ?CONSTITUTIONAL: Well-developed, well-nourished female in no acute distress.  ?HENT:  Normocephalic, atraumatic, External right and left ear normal. Oropharynx is clear and moist ?EYES: Conjunctivae and EOM are normal. Pupils are equal, round, and reactive to light. No scleral icterus.  ?NECK: Normal range of  motion, supple, no masses.  Normal thyroid.  ?SKIN: Skin is warm and dry. No rash noted. Not diaphoretic. No erythema. No pallor. ?MUSCULOSKELETAL: Normal range of motion. No tenderness.  No cyanosis, clubbing, or edema.  2+ distal pulses. ?NEUROLOGIC: Alert and oriented to person, place, and time. Normal reflexes, muscle tone coordination.  ?PSYCHIATRIC: Normal mood and affect. Normal behavior. Normal judgment and thought content. ?CARDIOVASCULAR: Normal heart rate noted, regular rhythm ?RESPIRATORY: Clear to auscultation bilaterally. Effort and breath sounds normal, no problems with respiration noted. ?BREASTS: Symmetric in size. No masses, tenderness, skin changes, nipple drainage, or lymphadenopathy bilaterally.  ?ABDOMEN: Soft, no distention noted.  No tenderness, rebound or guarding.  ?PELVIC: Normal appearing external genitalia and urethral meatus; normal appearing vaginal mucosa and cervix.  No abnormal discharge noted.  Pap smear collected.  Normal uterine size, no other palpable masses, no uterine or adnexal tenderness.  . ?  ?Assessment and Plan:  ?  1. Well woman exam with routine gynecological exam ? ? ? ?Pap: Will follow up results of pap smear and manage accordingly. ?Mammogram :N/a ?Labs: none/ U/s for infertility ( state she has not had any work up done.  ?Refills: none ?Referral: discussed  fertility specialist ?Discussed semen analysis for her husband. Reviewed medications for ovulation stimulation. Information sheet on Femara given. Once pt partner has a normal analysis then discussed ordering med

## 2021-07-01 ENCOUNTER — Encounter: Payer: Self-pay | Admitting: Certified Nurse Midwife

## 2021-07-01 LAB — CYTOLOGY - PAP
Comment: NEGATIVE
Diagnosis: UNDETERMINED — AB
High risk HPV: NEGATIVE

## 2021-07-08 ENCOUNTER — Ambulatory Visit (INDEPENDENT_AMBULATORY_CARE_PROVIDER_SITE_OTHER): Payer: 59

## 2021-07-08 DIAGNOSIS — Z01419 Encounter for gynecological examination (general) (routine) without abnormal findings: Secondary | ICD-10-CM

## 2021-07-08 DIAGNOSIS — N979 Female infertility, unspecified: Secondary | ICD-10-CM | POA: Diagnosis not present

## 2021-07-13 ENCOUNTER — Telehealth: Payer: Self-pay | Admitting: Certified Nurse Midwife

## 2021-07-13 ENCOUNTER — Other Ambulatory Visit: Payer: Self-pay

## 2021-07-13 NOTE — Telephone Encounter (Signed)
Pt would like to results of her transvaginal US . Please call  ?

## 2021-07-14 NOTE — Telephone Encounter (Signed)
Called [place] discussed u/s results and possible plan of care for management. Discussed watchful waiting vs surgical removal.  Pt requesting surgical removal. Explained that it is not an emergency and can be done out patient. She verbalizes understanding and agreement. She will call Dr. Amalia Hailey and schedule pre operative visit in the next few days.  ? ?Philip Aspen, CNM  ?

## 2021-07-16 DIAGNOSIS — D270 Benign neoplasm of right ovary: Secondary | ICD-10-CM | POA: Diagnosis not present

## 2021-08-06 ENCOUNTER — Encounter: Payer: 59 | Admitting: Obstetrics and Gynecology

## 2021-08-06 DIAGNOSIS — Z01818 Encounter for other preprocedural examination: Secondary | ICD-10-CM

## 2021-08-07 ENCOUNTER — Other Ambulatory Visit: Payer: Self-pay

## 2021-08-07 DIAGNOSIS — N9489 Other specified conditions associated with female genital organs and menstrual cycle: Secondary | ICD-10-CM | POA: Diagnosis not present

## 2021-08-07 DIAGNOSIS — D270 Benign neoplasm of right ovary: Secondary | ICD-10-CM | POA: Diagnosis not present

## 2021-08-07 MED ORDER — OXYCODONE-ACETAMINOPHEN 5-325 MG PO TABS
ORAL_TABLET | ORAL | 0 refills | Status: AC
  Filled 2021-08-07: qty 30, 5d supply, fill #0

## 2021-08-07 MED ORDER — ONDANSETRON 4 MG PO TBDP
ORAL_TABLET | ORAL | 0 refills | Status: AC
  Filled 2021-08-07: qty 8, 2d supply, fill #0

## 2021-08-26 ENCOUNTER — Other Ambulatory Visit: Payer: Self-pay

## 2021-09-02 ENCOUNTER — Encounter: Payer: Self-pay | Admitting: Certified Nurse Midwife

## 2021-09-16 ENCOUNTER — Other Ambulatory Visit: Payer: Self-pay

## 2021-09-16 MED ORDER — NITROFURANTOIN MONOHYD MACRO 100 MG PO CAPS
ORAL_CAPSULE | ORAL | 0 refills | Status: AC
  Filled 2021-09-16: qty 10, 5d supply, fill #0

## 2021-09-23 ENCOUNTER — Other Ambulatory Visit: Payer: Self-pay

## 2021-09-24 ENCOUNTER — Other Ambulatory Visit: Payer: Self-pay

## 2021-09-24 MED ORDER — FAMOTIDINE 20 MG PO TABS
ORAL_TABLET | ORAL | 0 refills | Status: AC
  Filled 2021-09-24 – 2021-10-19 (×2): qty 180, 90d supply, fill #0

## 2021-09-24 MED ORDER — FLUTICASONE PROPIONATE 50 MCG/ACT NA SUSP
NASAL | 0 refills | Status: AC
  Filled 2021-09-24: qty 48, 90d supply, fill #0
  Filled 2021-10-19: qty 16, 30d supply, fill #0

## 2021-09-28 ENCOUNTER — Other Ambulatory Visit: Payer: Self-pay

## 2021-10-01 ENCOUNTER — Other Ambulatory Visit: Payer: Self-pay

## 2021-10-02 ENCOUNTER — Other Ambulatory Visit: Payer: Self-pay

## 2021-10-04 ENCOUNTER — Other Ambulatory Visit: Payer: Self-pay

## 2021-10-05 ENCOUNTER — Other Ambulatory Visit: Payer: Self-pay

## 2021-10-05 MED ORDER — CYANOCOBALAMIN 1000 MCG/ML IJ SOLN
INTRAMUSCULAR | 0 refills | Status: AC
  Filled 2021-10-05: qty 3, 90d supply, fill #0
  Filled 2021-10-19: qty 3, 84d supply, fill #0

## 2021-10-13 ENCOUNTER — Other Ambulatory Visit: Payer: Self-pay

## 2021-10-19 ENCOUNTER — Other Ambulatory Visit: Payer: Self-pay

## 2021-12-23 DIAGNOSIS — N912 Amenorrhea, unspecified: Secondary | ICD-10-CM | POA: Diagnosis not present

## 2021-12-23 DIAGNOSIS — O99210 Obesity complicating pregnancy, unspecified trimester: Secondary | ICD-10-CM | POA: Diagnosis not present

## 2021-12-23 DIAGNOSIS — O161 Unspecified maternal hypertension, first trimester: Secondary | ICD-10-CM | POA: Diagnosis not present

## 2021-12-23 DIAGNOSIS — Z3201 Encounter for pregnancy test, result positive: Secondary | ICD-10-CM | POA: Diagnosis not present

## 2021-12-23 DIAGNOSIS — R638 Other symptoms and signs concerning food and fluid intake: Secondary | ICD-10-CM | POA: Diagnosis not present

## 2021-12-23 DIAGNOSIS — Z3689 Encounter for other specified antenatal screening: Secondary | ICD-10-CM | POA: Diagnosis not present

## 2021-12-23 DIAGNOSIS — N911 Secondary amenorrhea: Secondary | ICD-10-CM | POA: Diagnosis not present

## 2021-12-23 DIAGNOSIS — R030 Elevated blood-pressure reading, without diagnosis of hypertension: Secondary | ICD-10-CM | POA: Diagnosis not present

## 2021-12-24 DIAGNOSIS — O0990 Supervision of high risk pregnancy, unspecified, unspecified trimester: Secondary | ICD-10-CM | POA: Diagnosis not present

## 2021-12-24 DIAGNOSIS — Z3A10 10 weeks gestation of pregnancy: Secondary | ICD-10-CM | POA: Diagnosis not present

## 2021-12-28 ENCOUNTER — Other Ambulatory Visit: Payer: Self-pay

## 2021-12-28 MED ORDER — NITROFURANTOIN MONOHYD MACRO 100 MG PO CAPS
100.0000 mg | ORAL_CAPSULE | Freq: Two times a day (BID) | ORAL | 0 refills | Status: AC
  Filled 2021-12-28: qty 14, 7d supply, fill #0

## 2021-12-29 DIAGNOSIS — Z3401 Encounter for supervision of normal first pregnancy, first trimester: Secondary | ICD-10-CM | POA: Diagnosis not present

## 2021-12-29 DIAGNOSIS — Z3A10 10 weeks gestation of pregnancy: Secondary | ICD-10-CM | POA: Diagnosis not present

## 2021-12-29 DIAGNOSIS — O0991 Supervision of high risk pregnancy, unspecified, first trimester: Secondary | ICD-10-CM | POA: Diagnosis not present

## 2022-01-05 DIAGNOSIS — O2341 Unspecified infection of urinary tract in pregnancy, first trimester: Secondary | ICD-10-CM | POA: Diagnosis not present

## 2022-01-08 ENCOUNTER — Other Ambulatory Visit: Payer: Self-pay

## 2022-01-08 ENCOUNTER — Emergency Department: Payer: 59

## 2022-01-08 ENCOUNTER — Encounter: Payer: Self-pay | Admitting: Intensive Care

## 2022-01-08 ENCOUNTER — Emergency Department
Admission: EM | Admit: 2022-01-08 | Discharge: 2022-01-09 | Disposition: A | Discharge status: Home / Self Care | Payer: 59 | Attending: Emergency Medicine | Admitting: Emergency Medicine

## 2022-01-08 DIAGNOSIS — O26891 Other specified pregnancy related conditions, first trimester: Secondary | ICD-10-CM | POA: Diagnosis not present

## 2022-01-08 DIAGNOSIS — O4690 Antepartum hemorrhage, unspecified, unspecified trimester: Secondary | ICD-10-CM

## 2022-01-08 DIAGNOSIS — N9489 Other specified conditions associated with female genital organs and menstrual cycle: Secondary | ICD-10-CM | POA: Diagnosis not present

## 2022-01-08 DIAGNOSIS — Z3A13 13 weeks gestation of pregnancy: Secondary | ICD-10-CM | POA: Diagnosis not present

## 2022-01-08 DIAGNOSIS — O209 Hemorrhage in early pregnancy, unspecified: Secondary | ICD-10-CM | POA: Insufficient documentation

## 2022-01-08 DIAGNOSIS — N939 Abnormal uterine and vaginal bleeding, unspecified: Secondary | ICD-10-CM | POA: Diagnosis not present

## 2022-01-08 LAB — COMPREHENSIVE METABOLIC PANEL
ALT: 17 U/L (ref 0–44)
AST: 21 U/L (ref 15–41)
Albumin: 4.3 g/dL (ref 3.5–5.0)
Alkaline Phosphatase: 48 U/L (ref 38–126)
Anion gap: 11 (ref 5–15)
BUN: 7 mg/dL (ref 6–20)
CO2: 21 mmol/L — ABNORMAL LOW (ref 22–32)
Calcium: 9.2 mg/dL (ref 8.9–10.3)
Chloride: 105 mmol/L (ref 98–111)
Creatinine, Ser: 0.68 mg/dL (ref 0.44–1.00)
GFR, Estimated: >60 mL/min (ref >60)
Glucose, Bld: 95 mg/dL (ref 70–99)
Potassium: 3.8 mmol/L (ref 3.5–5.1)
Sodium: 137 mmol/L (ref 135–145)
Total Bilirubin: 0.5 mg/dL (ref 0.3–1.2)
Total Protein: 7.3 g/dL (ref 6.5–8.1)

## 2022-01-08 LAB — URINALYSIS, ROUTINE W REFLEX MICROSCOPIC
Bilirubin Urine: NEGATIVE
Glucose, UA: NEGATIVE mg/dL
Hgb urine dipstick: NEGATIVE
Ketones, ur: NEGATIVE mg/dL
Leukocytes,Ua: NEGATIVE
Nitrite: NEGATIVE
Protein, ur: NEGATIVE mg/dL
Specific Gravity, Urine: 1.001 — ABNORMAL LOW (ref 1.005–1.030)
pH: 7 (ref 5.0–8.0)

## 2022-01-08 LAB — LIPASE, BLOOD: Lipase: 38 U/L (ref 11–51)

## 2022-01-08 LAB — CBC
HCT: 38.3 % (ref 36.0–46.0)
Hemoglobin: 12.8 g/dL (ref 12.0–15.0)
MCH: 31 pg (ref 26.0–34.0)
MCHC: 33.4 g/dL (ref 30.0–36.0)
MCV: 92.7 fL (ref 80.0–100.0)
Platelets: 249 10*3/uL (ref 150–400)
RBC: 4.13 MIL/uL (ref 3.87–5.11)
RDW: 12.7 % (ref 11.5–15.5)
WBC: 6.9 10*3/uL (ref 4.0–10.5)
nRBC: 0 % (ref 0.0–0.2)

## 2022-01-08 LAB — HCG, QUANTITATIVE, PREGNANCY: hCG, Beta Chain, Quant, S: 178848 m[IU]/mL — ABNORMAL HIGH (ref <5)

## 2022-01-08 LAB — POC URINE PREG, ED: Preg Test, Ur: POSITIVE — AB

## 2022-01-08 MED ORDER — RHO D IMMUNE GLOBULIN 1500 UNIT/2ML IJ SOSY
300.0000 ug | PREFILLED_SYRINGE | Freq: Once | INTRAMUSCULAR | Status: AC
  Administered 2022-01-08: 300 ug via INTRAMUSCULAR
  Filled 2022-01-08: qty 2

## 2022-01-08 NOTE — ED Provider Notes (Signed)
[facility] Provider Note    Event Date/Time   First MD Initiated Contact with Patient 01/08/22 1855     (approximate)   History   Vaginal Bleeding   HPI  Jill Ortiz is a 31 y.o. female   Past medical history of no significant past medical history presents to the emergency department approximately [redacted] weeks pregnant with some vaginal bleeding, spotting occurring today.  No abdominal cramping, or pain.  She is otherwise been in her regular state of health and has no other complaints at this time.    History was obtained via patient and her spouse at bedside.      Physical Exam   Triage Vital Signs: ED Triage Vitals [01/08/22 1702]  Enc Vitals Group     BP (!) 143/87     Pulse Rate 94     Resp 16     Temp 98.6 F (37 C)     Temp Source Oral     SpO2 97 %     Weight 220 lb (99.8 kg)     Height '5\' 6"'$  (1.676 m)     Head Circumference      Peak Flow      Pain Score 0     Pain Loc      Pain Edu?      Excl. in [place]?     Most recent vital signs: Vitals:   01/08/22 1702  BP: (!) 143/87  Pulse: 94  Resp: 16  Temp: 98.6 F (37 C)  SpO2: 97%    General: Awake, no distress.  CV:  Good peripheral perfusion.  Resp:  Normal effort. Abd:  No distention.  Nontender to palpation. Other:  Pelvic exam deferred   ED Results / Procedures / Treatments   Labs (all labs ordered are listed, but only abnormal results are displayed) Labs Reviewed  COMPREHENSIVE METABOLIC PANEL - Abnormal; Notable for the following components:      Result Value   CO2 21 (*)    All other components within normal limits  URINALYSIS, ROUTINE W REFLEX MICROSCOPIC - Abnormal; Notable for the following components:   Color, Urine COLORLESS (*)    APPearance CLEAR (*)    Specific Gravity, Urine 1.001 (*)    All other components within normal limits  HCG, QUANTITATIVE, PREGNANCY - Abnormal; Notable for the following components:   hCG, Beta Chain, Quant, [place] 178,848 (*)     All other components within normal limits  POC URINE PREG, ED - Abnormal; Notable for the following components:   Preg Test, Ur POSITIVE (*)    All other components within normal limits  LIPASE, BLOOD  CBC  TYPE AND SCREEN  ABO/RH     I reviewed labs and they are notable for positive pregnancy test with 170000+ beta quantitative hCG and normal H&H.   PROCEDURES:  Critical Care performed: No  Procedures   MEDICATIONS ORDERED IN ED: Medications  rho (d) immune globulin (RHIG/RHOPHYLAC) injection 300 mcg (has no administration in time range)     IMPRESSION / MDM / ASSESSMENT AND PLAN / ED COURSE  I reviewed the triage vital signs and the nursing notes.                              Differential diagnosis includes, but is not limited to, early miscarriage, ectopic pregnancy, anemia blood loss    MDM: Hemodynamically stable with normal H&H and scant  bleeding reported by patient with no abdominal pain.  US shows single live intrauterine pregnancy.  Review of external labs shows that the patient is Rh-, will give RhoGAM in the emergency department.  Initial triage vitals rightly over 140/80, will recheck  Patient's presentation is most consistent with acute presentation with potential threat to life or bodily function.       FINAL CLINICAL IMPRESSION(S) / ED DIAGNOSES   Final diagnoses:  None     Rx / DC Orders   ED Discharge Orders     None        Note:  This document was prepared using Dragon voice recognition software and may include unintentional dictation errors.    Lucillie Garfinkel, MD 01/08/22 2351

## 2022-01-08 NOTE — ED Provider Triage Note (Signed)
  Emergency Medicine Provider Triage Evaluation Note  Jill Ortiz , a 31 y.o.female,  was evaluated in triage.  Pt complains of vaginal bleeding.  She states that she is [redacted] weeks pregnant has had bleeding since 3 PM.  Reports one blood clot.  She is only gone through 1 pad.   Review of Systems  Positive: Vaginal bleeding Negative: Denies fever, chest pain, vomiting  Physical Exam   Vitals:   01/08/22 1702  BP: (!) 143/87  Pulse: 94  Resp: 16  Temp: 98.6 F (37 C)  SpO2: 97%   Gen:   Awake, no distress   Resp:  Normal effort  MSK:   Moves extremities without difficulty  Other:    Medical Decision Making  Given the patient's initial medical screening exam, the following diagnostic evaluation has been ordered. The patient will be placed in the appropriate treatment space, once one is available, to complete the evaluation and treatment. I have discussed the plan of care with the patient and I have advised the patient that an ED physician or mid-level practitioner will reevaluate their condition after the test results have been received, as the results may give them additional insight into the type of treatment they may need.    Diagnostics: Labs, urinalysis, beta-hCG, ultrasound  Treatments: none immediately   Teodoro Spray, [place] 01/08/22 1834

## 2022-01-08 NOTE — ED Triage Notes (Signed)
Patient reports she is [redacted] weeks pregnant and having vaginal bleeding. Reports vaginal bleeding since 3pm. Reports she has not even gone through 1 pad and reports 1 clot. Denies pain

## 2022-01-09 LAB — RHOGAM INJECTION: Unit division: 0

## 2022-01-09 LAB — ABO/RH: ABO/RH(D): A NEG

## 2022-01-09 LAB — TYPE AND SCREEN
ABO/RH(D): A NEG
Antibody Screen: NEGATIVE

## 2022-01-09 NOTE — ED Notes (Signed)
Pt Dc to home. Dc instructions reviewed with all questions answered. Pt verbalizes understanding. Pt ambulatory out of dept with steady gait 

## 2022-01-22 ENCOUNTER — Other Ambulatory Visit: Payer: Self-pay

## 2022-01-25 ENCOUNTER — Other Ambulatory Visit: Payer: Self-pay

## 2022-01-25 DIAGNOSIS — Z113 Encounter for screening for infections with a predominantly sexual mode of transmission: Secondary | ICD-10-CM | POA: Diagnosis not present

## 2022-01-25 DIAGNOSIS — E538 Deficiency of other specified B group vitamins: Secondary | ICD-10-CM | POA: Diagnosis not present

## 2022-01-25 MED ORDER — ALBUTEROL SULFATE HFA 108 (90 BASE) MCG/ACT IN AERS
INHALATION_SPRAY | RESPIRATORY_TRACT | 3 refills | Status: AC
  Filled 2022-01-25: qty 6.7, 25d supply, fill #0

## 2022-01-25 MED ORDER — CYANOCOBALAMIN 1000 MCG/ML IJ SOLN
INTRAMUSCULAR | 3 refills | Status: AC
  Filled 2022-01-25: qty 3, 90d supply, fill #0
  Filled 2022-04-18: qty 3, 90d supply, fill #1
  Filled 2022-07-15: qty 3, 90d supply, fill #2
  Filled 2022-09-01 – 2022-10-20 (×2): qty 3, 90d supply, fill #3

## 2022-02-22 DIAGNOSIS — Z3A19 19 weeks gestation of pregnancy: Secondary | ICD-10-CM | POA: Diagnosis not present

## 2022-02-22 DIAGNOSIS — O322XX0 Maternal care for transverse and oblique lie, not applicable or unspecified: Secondary | ICD-10-CM | POA: Diagnosis not present

## 2022-03-20 ENCOUNTER — Ambulatory Visit
Admission: EM | Admit: 2022-03-20 | Discharge: 2022-03-20 | Disposition: A | Discharge status: Home / Self Care | Payer: 59 | Attending: Physician Assistant | Admitting: Physician Assistant

## 2022-03-20 ENCOUNTER — Encounter: Payer: Self-pay | Admitting: Emergency Medicine

## 2022-03-20 DIAGNOSIS — Z3A23 23 weeks gestation of pregnancy: Secondary | ICD-10-CM

## 2022-03-20 DIAGNOSIS — J019 Acute sinusitis, unspecified: Secondary | ICD-10-CM

## 2022-03-20 MED ORDER — AMOXICILLIN 500 MG PO CAPS: 1000.0000 mg | ORAL_CAPSULE | Freq: Two times a day (BID) | ORAL | 0 refills | Status: AC

## 2022-03-20 NOTE — ED Provider Notes (Signed)
[facility]    CSN: [id number] Arrival date & time: 03/20/22  1102      History   Chief Complaint Chief Complaint  Patient presents with   Cough   Sinus Problem    HPI Jill Ortiz is a 31 y.o. female nasal congestion and sinus pressure x 1 week. Cough x 2 months. No fever. Reports ear pain and pressure.  She is denying any chest pain or breathing difficulty.  Mild scratchy/sore throat.  Over the past couple days she has developed ain of right maxillary sinus and behind right eye. Increased pain with leaning forward and pain radiating to teeth.  She has tried Afrin and Tylenol sinus without relief.  She says she knows there is a lot of medication she currently takes and she is [redacted] weeks pregnant.  Has a follow-up with her OB/GYN this coming week.    HPI  Past Medical History:  Diagnosis Date   Anxiety    Glucose intolerance (impaired glucose tolerance)    Seasonal allergies     Patient Active Problem List   Diagnosis Date Noted   Infertility, female 06/29/2021   Herpes labialis 10/18/2017   ASCUS of cervix with negative high risk HPV 10/17/2017   Vitamin B12 deficiency 10/17/2017   S/P gastric bypass 04/27/2017   Insomnia due to other mental disorder 04/27/2017   Seasonal allergic rhinitis due to pollen 07/25/2016   Obesity 12/26/2015   Family history of breast cancer 06/06/2015   Anxiety state 02/28/2015    Past Surgical History:  Procedure Laterality Date   Endocrinology consultation  09/26/2012   TSH slightly elevated, elevated cortisol.  Repeat labs normal.  Elevated cortisol due to OCPs.  No treatment warranted.   GASTRIC BYPASS  10/19/2012   [facility].   POLYSOMNOGRAPHY  08/27/2012   no OSA.  Pre-operative clearance for gastric bypass.   TONSILLECTOMY AND ADENOIDECTOMY      OB History     Gravida  1   Para      Term      Preterm      AB      Living         SAB      IAB      Ectopic      Multiple      Live Births                Home Medications    Prior to Admission medications   Medication Sig Start Date End Date Taking? Authorizing Provider  amoxicillin (AMOXIL) 500 MG capsule Take 2 capsules (1,000 mg total) by mouth 2 (two) times daily for 7 days. 03/20/22 03/27/22 Yes Laurene Footman B, PA-C  albuterol (PROVENTIL HFA;VENTOLIN HFA) 108 (90 Base) MCG/ACT inhaler Inhale 2 puffs into the lungs every 4 (four) hours as needed for wheezing or shortness of breath (cough, shortness of breath or wheezing.). 10/17/17   Wardell Honour, MD  albuterol (VENTOLIN HFA) 108 (90 Base) MCG/ACT inhaler Inhale 2 inhalations into the lungs every 4 (four) hours as needed for Wheezing 01/25/22     ALPRAZolam (XANAX) 0.5 MG tablet Take 1 tablet (0.5 mg total) by mouth at bedtime as needed for sleep. 04/25/17   Wardell Honour, MD  cyanocobalamin (,VITAMIN B-12,) 1000 MCG/ML injection Inject 1 mL (1,000 mcg total) into the muscle every 30 (thirty) days. 10/17/17   Wardell Honour, MD  cyanocobalamin (,VITAMIN B-12,) 1000 MCG/ML injection Inject 1 mL (1,000 mcg total) into the  muscle monthly 02/03/21     cyanocobalamin (,VITAMIN B-12,) 1000 MCG/ML injection Inject 1 mL (1,000 mcg total) into the muscle monthly 10/05/21     cyanocobalamin (VITAMIN B12) 1000 MCG/ML injection Inject 1 mL (1,000 mcg total) into the muscle monthly 01/25/22     famotidine (PEPCID) 20 MG tablet TAKE ONE TABLET BY MOUTH 2 TIMES DAILY AS NEEDED FOR HEARTBURN 11/28/19     famotidine (PEPCID) 20 MG tablet Take 1 tablet (20 mg total) by mouth 2 (two) times daily as needed for Heartburn 09/24/21     fluticasone (FLONASE) 50 MCG/ACT nasal spray Place 2 sprays into both nostrils once daily 09/24/21     nitrofurantoin, macrocrystal-monohydrate, (MACROBID) 100 MG capsule Take 1 capsule (100 mg total) by mouth 2 (two) times daily for 5 days 09/16/21     nitrofurantoin, macrocrystal-monohydrate, (MACROBID) 100 MG capsule Take 1 capsule (100 mg total) by mouth 2 (two) times daily  for 7 days 12/28/21     ondansetron (ZOFRAN-ODT) 4 MG disintegrating tablet Take 1 tablet (4 mg total) by mouth every 8 (eight) hours as needed for Nausea for up to 7 days 08/07/21     oxyCODONE-acetaminophen (PERCOCET/ROXICET) 5-325 MG tablet Take 1 tablet by mouth every 4 (four) hours as needed for Pain for up to 5 days 08/07/21     phentermine (ADIPEX-P) 37.5 MG tablet Take 1 tablet (37.5 mg total) by mouth every morning before breakfast 06/12/21     valACYclovir (VALTREX) 1000 MG tablet Take 2 tablets (2,000 mg total) by mouth 2 (two) times daily For 2 doses prn 02/03/21       Family History Family History  Problem Relation Age of Onset   Diabetes Mother    Hyperlipidemia Mother    Hypertension Mother    COPD Mother    Heart disease Mother        heart arrhythmia; V. Tach.  CHF   Hyperlipidemia Father    Diabetes Maternal Grandmother    Diabetes Paternal Grandmother     Social History Social History   Tobacco Use   Smoking status: Never   Smokeless tobacco: Never  Vaping Use   Vaping Use: Never used  Substance Use Topics   Alcohol use: Yes   Drug use: No     Allergies   Bee venom   Review of Systems Review of Systems  Constitutional:  Negative for chills, diaphoresis, fatigue and fever.  HENT:  Positive for congestion, ear pain, rhinorrhea, sinus pressure, sinus pain and sore throat.   Respiratory:  Positive for cough. Negative for shortness of breath.   Gastrointestinal:  Negative for abdominal pain, nausea and vomiting.  Musculoskeletal:  Negative for arthralgias and myalgias.  Skin:  Negative for rash.  Neurological:  Positive for headaches. Negative for weakness.  Hematological:  Negative for adenopathy.     Physical Exam Triage Vital Signs ED Triage Vitals  Enc Vitals Group     BP 03/20/22 1316 114/78     Pulse Rate 03/20/22 1316 97     Resp 03/20/22 1316 14     Temp 03/20/22 1316 97.6 F (36.4 C)     Temp Source 03/20/22 1316 Oral     SpO2 03/20/22  1316 98 %     Weight 03/20/22 1315 220 lb (99.8 kg)     Height --      Head Circumference --      Peak Flow --      Pain Score 03/20/22 1314 4  Pain Loc --      Pain Edu? --      Excl. in [place]? --    No data found.  Updated Vital Signs BP 114/78 (BP Location: Right Arm)   Pulse 97   Temp 97.6 F (36.4 C) (Oral)   Resp 14   Wt 220 lb (99.8 kg)   LMP 06/21/2021 (Exact Date)   SpO2 98%   BMI 35.51 kg/m   Physical Exam Vitals and nursing note reviewed.  Constitutional:      General: She is not in acute distress.    Appearance: Normal appearance. She is not ill-appearing or toxic-appearing.  HENT:     Head: Normocephalic and atraumatic.     Right Ear: Ear canal and external ear normal. A middle ear effusion is present.     Left Ear: Ear canal and external ear normal. A middle ear effusion is present.     Nose: Congestion present.     Mouth/Throat:     Mouth: Mucous membranes are moist.     Pharynx: Oropharynx is clear. Posterior oropharyngeal erythema (mild injection of posterior pharynx) present.  Eyes:     General: No scleral icterus.       Right eye: No discharge.        Left eye: No discharge.     Conjunctiva/sclera: Conjunctivae normal.  Cardiovascular:     Rate and Rhythm: Normal rate and regular rhythm.     Heart sounds: Normal heart sounds.  Pulmonary:     Effort: Pulmonary effort is normal. No respiratory distress.     Breath sounds: Normal breath sounds.  Musculoskeletal:     Cervical back: Neck supple.  Skin:    General: Skin is dry.  Neurological:     General: No focal deficit present.     Mental Status: She is alert. Mental status is at baseline.     Motor: No weakness.     Gait: Gait normal.  Psychiatric:        Mood and Affect: Mood normal.        Behavior: Behavior normal.        Thought Content: Thought content normal.      UC Treatments / Results  Labs (all labs ordered are listed, but only abnormal results are displayed) Labs Reviewed  - No data to display  EKG   Radiology No results found.  Procedures Procedures (including critical care time)  Medications Ordered in UC Medications - No data to display  Initial Impression / Assessment and Plan / UC Course  I have reviewed the triage vital signs and the nursing notes.  Pertinent labs & imaging results that were available during my care of the patient were reviewed by me and considered in my medical decision making (see chart for details).   31 year old female who is [redacted] weeks pregnant presents for 1 week history of worsening sinus pressure and pain along with nasal congestion.  She has had a cough for the past 2 months as well.  No fever or shortness of breath.  Tried OTC meds without relief.  Vitals are stable and normal.  She is overall well-appearing.  In no acute distress.  On exam she has nasal congestion without drainage and erythema/injection and petechia of the posterior pharynx.  Addition she has clear effusion of bilateral TMs.  Chest clear to auscultation heart regular rate and rhythm.  Suspect secondary bacterial acute sinusitis.  Will treat with amoxicillin at this time.  Advised may continue with  Tylenol cold and sinus medication, Afrin, Flonase.  Limit Afrin to 2 to 3 days.  Plenty rest and fluids.  Reviewed following up with OB/GYN.  ED precautions discussed.   Final Clinical Impressions(s) / UC Diagnoses   Final diagnoses:  Acute sinusitis, recurrence not specified, unspecified location  [redacted] weeks gestation of pregnancy   Discharge Instructions   None    ED Prescriptions     Medication Sig Dispense Auth. Provider   amoxicillin (AMOXIL) 500 MG capsule Take 2 capsules (1,000 mg total) by mouth 2 (two) times daily for 7 days. 28 capsule Danton Clap, PA-C      PDMP not reviewed this encounter.   Danton Clap, PA-C 03/20/22 1344

## 2022-03-20 NOTE — ED Triage Notes (Signed)
Patient c/o cough and chest congestion for a month.  Patient reports sinus pain and pressure and headache that has gotten worse over the past week.  Patient is currently pregnant.  Patient denies fevers.

## 2022-04-16 ENCOUNTER — Other Ambulatory Visit: Payer: Self-pay

## 2022-04-16 MED ORDER — FAMOTIDINE 20 MG PO TABS
20.0000 mg | ORAL_TABLET | Freq: Two times a day (BID) | ORAL | 3 refills | Status: AC
  Filled 2022-04-16: qty 180, 90d supply, fill #0
  Filled 2022-07-15: qty 180, 90d supply, fill #1
  Filled 2022-10-20: qty 180, 90d supply, fill #2
  Filled 2023-02-05: qty 180, 90d supply, fill #3

## 2022-04-23 DIAGNOSIS — O0992 Supervision of high risk pregnancy, unspecified, second trimester: Secondary | ICD-10-CM | POA: Diagnosis not present

## 2022-04-23 DIAGNOSIS — Z6791 Unspecified blood type, Rh negative: Secondary | ICD-10-CM | POA: Diagnosis not present

## 2022-04-23 DIAGNOSIS — O26892 Other specified pregnancy related conditions, second trimester: Secondary | ICD-10-CM | POA: Diagnosis not present

## 2022-04-23 DIAGNOSIS — O99212 Obesity complicating pregnancy, second trimester: Secondary | ICD-10-CM | POA: Diagnosis not present

## 2022-04-23 DIAGNOSIS — B002 Herpesviral gingivostomatitis and pharyngotonsillitis: Secondary | ICD-10-CM | POA: Diagnosis not present

## 2022-04-23 DIAGNOSIS — Z3A27 27 weeks gestation of pregnancy: Secondary | ICD-10-CM | POA: Diagnosis not present

## 2022-04-23 DIAGNOSIS — Z23 Encounter for immunization: Secondary | ICD-10-CM | POA: Diagnosis not present

## 2022-05-03 DIAGNOSIS — O99210 Obesity complicating pregnancy, unspecified trimester: Secondary | ICD-10-CM | POA: Diagnosis not present

## 2022-05-03 DIAGNOSIS — Z3A29 29 weeks gestation of pregnancy: Secondary | ICD-10-CM | POA: Diagnosis not present

## 2022-05-31 DIAGNOSIS — E669 Obesity, unspecified: Secondary | ICD-10-CM | POA: Diagnosis not present

## 2022-05-31 DIAGNOSIS — Z3A33 33 weeks gestation of pregnancy: Secondary | ICD-10-CM | POA: Diagnosis not present

## 2022-05-31 DIAGNOSIS — O99213 Obesity complicating pregnancy, third trimester: Secondary | ICD-10-CM | POA: Diagnosis not present

## 2022-06-16 ENCOUNTER — Other Ambulatory Visit: Payer: Self-pay

## 2022-06-16 MED ORDER — VALACYCLOVIR HCL 500 MG PO TABS
500.0000 mg | ORAL_TABLET | Freq: Every day | ORAL | 0 refills | Status: AC
  Filled 2022-06-16: qty 60, 60d supply, fill #0

## 2022-06-24 DIAGNOSIS — O0990 Supervision of high risk pregnancy, unspecified, unspecified trimester: Secondary | ICD-10-CM | POA: Diagnosis not present

## 2022-06-24 DIAGNOSIS — E669 Obesity, unspecified: Secondary | ICD-10-CM | POA: Diagnosis not present

## 2022-06-24 DIAGNOSIS — O26899 Other specified pregnancy related conditions, unspecified trimester: Secondary | ICD-10-CM | POA: Diagnosis not present

## 2022-06-24 DIAGNOSIS — Z3A36 36 weeks gestation of pregnancy: Secondary | ICD-10-CM | POA: Diagnosis not present

## 2022-06-24 DIAGNOSIS — O99213 Obesity complicating pregnancy, third trimester: Secondary | ICD-10-CM | POA: Diagnosis not present

## 2022-06-24 DIAGNOSIS — Z6791 Unspecified blood type, Rh negative: Secondary | ICD-10-CM | POA: Diagnosis not present

## 2022-06-24 DIAGNOSIS — B002 Herpesviral gingivostomatitis and pharyngotonsillitis: Secondary | ICD-10-CM | POA: Diagnosis not present

## 2022-06-24 DIAGNOSIS — O99210 Obesity complicating pregnancy, unspecified trimester: Secondary | ICD-10-CM | POA: Diagnosis not present

## 2022-06-29 DIAGNOSIS — O99213 Obesity complicating pregnancy, third trimester: Secondary | ICD-10-CM | POA: Diagnosis not present

## 2022-06-29 DIAGNOSIS — Z3A37 37 weeks gestation of pregnancy: Secondary | ICD-10-CM | POA: Diagnosis not present

## 2022-07-06 ENCOUNTER — Other Ambulatory Visit: Payer: Self-pay

## 2022-07-08 DIAGNOSIS — O26893 Other specified pregnancy related conditions, third trimester: Secondary | ICD-10-CM | POA: Diagnosis not present

## 2022-07-08 DIAGNOSIS — O3663X0 Maternal care for excessive fetal growth, third trimester, not applicable or unspecified: Secondary | ICD-10-CM | POA: Diagnosis not present

## 2022-07-08 DIAGNOSIS — O326XX0 Maternal care for compound presentation, not applicable or unspecified: Secondary | ICD-10-CM | POA: Diagnosis not present

## 2022-07-08 DIAGNOSIS — O411230 Chorioamnionitis, third trimester, not applicable or unspecified: Secondary | ICD-10-CM | POA: Diagnosis not present

## 2022-07-08 DIAGNOSIS — O99844 Bariatric surgery status complicating childbirth: Secondary | ICD-10-CM | POA: Diagnosis not present

## 2022-07-08 DIAGNOSIS — O99214 Obesity complicating childbirth: Secondary | ICD-10-CM | POA: Diagnosis not present

## 2022-07-08 DIAGNOSIS — O99284 Endocrine, nutritional and metabolic diseases complicating childbirth: Secondary | ICD-10-CM | POA: Diagnosis not present

## 2022-07-08 DIAGNOSIS — O99344 Other mental disorders complicating childbirth: Secondary | ICD-10-CM | POA: Diagnosis not present

## 2022-07-08 DIAGNOSIS — O411430 Placentitis, third trimester, not applicable or unspecified: Secondary | ICD-10-CM | POA: Diagnosis not present

## 2022-07-08 DIAGNOSIS — Z3A38 38 weeks gestation of pregnancy: Secondary | ICD-10-CM | POA: Diagnosis not present

## 2022-07-08 DIAGNOSIS — O99210 Obesity complicating pregnancy, unspecified trimester: Secondary | ICD-10-CM | POA: Diagnosis not present

## 2022-07-08 DIAGNOSIS — O9852 Other viral diseases complicating childbirth: Secondary | ICD-10-CM | POA: Diagnosis not present

## 2022-07-08 DIAGNOSIS — O43813 Placental infarction, third trimester: Secondary | ICD-10-CM | POA: Diagnosis not present

## 2022-07-08 DIAGNOSIS — O34211 Maternal care for low transverse scar from previous cesarean delivery: Secondary | ICD-10-CM | POA: Diagnosis not present

## 2022-07-08 DIAGNOSIS — Z3A39 39 weeks gestation of pregnancy: Secondary | ICD-10-CM | POA: Diagnosis not present

## 2022-07-11 ENCOUNTER — Other Ambulatory Visit: Payer: Self-pay

## 2022-07-11 MED ORDER — DOCUSATE SODIUM 100 MG PO CAPS: ORAL_CAPSULE | ORAL | 0 refills | Status: AC

## 2022-07-11 MED ORDER — OXYCODONE HCL 5 MG PO TABS
5.0000 mg | ORAL_TABLET | Freq: Four times a day (QID) | ORAL | 0 refills | Status: AC | PRN
  Filled 2022-07-11: qty 5, 2d supply, fill #0

## 2022-07-11 MED ORDER — IBUPROFEN 600 MG PO TABS
600.0000 mg | ORAL_TABLET | Freq: Four times a day (QID) | ORAL | 0 refills | Status: AC | PRN
  Filled 2022-07-11: qty 30, 8d supply, fill #0

## 2022-07-12 ENCOUNTER — Other Ambulatory Visit: Payer: Self-pay

## 2022-08-19 DIAGNOSIS — Z1332 Encounter for screening for maternal depression: Secondary | ICD-10-CM | POA: Diagnosis not present

## 2022-08-20 ENCOUNTER — Other Ambulatory Visit: Payer: Self-pay

## 2022-08-20 MED ORDER — VALACYCLOVIR HCL 500 MG PO TABS
500.0000 mg | ORAL_TABLET | Freq: Two times a day (BID) | ORAL | 6 refills | Status: AC
  Filled 2022-08-20 – 2022-09-01 (×2): qty 30, 15d supply, fill #0
  Filled 2022-11-04: qty 30, 15d supply, fill #1
  Filled 2022-12-06: qty 30, 15d supply, fill #2
  Filled 2023-02-05: qty 30, 15d supply, fill #3
  Filled 2023-06-07: qty 30, 15d supply, fill #4

## 2022-08-30 ENCOUNTER — Other Ambulatory Visit: Payer: Self-pay

## 2022-09-01 ENCOUNTER — Other Ambulatory Visit: Payer: Self-pay

## 2022-09-09 ENCOUNTER — Other Ambulatory Visit: Payer: Self-pay

## 2022-09-09 DIAGNOSIS — Z30430 Encounter for insertion of intrauterine contraceptive device: Secondary | ICD-10-CM | POA: Diagnosis not present

## 2022-09-09 DIAGNOSIS — N898 Other specified noninflammatory disorders of vagina: Secondary | ICD-10-CM | POA: Diagnosis not present

## 2022-09-09 DIAGNOSIS — Z124 Encounter for screening for malignant neoplasm of cervix: Secondary | ICD-10-CM | POA: Diagnosis not present

## 2022-09-09 MED ORDER — METRONIDAZOLE 500 MG PO TABS
500.0000 mg | ORAL_TABLET | Freq: Two times a day (BID) | ORAL | 0 refills | Status: AC
  Filled 2022-09-09: qty 14, 7d supply, fill #0

## 2022-10-12 ENCOUNTER — Other Ambulatory Visit: Payer: Self-pay

## 2022-10-12 DIAGNOSIS — R300 Dysuria: Secondary | ICD-10-CM | POA: Diagnosis not present

## 2022-10-12 DIAGNOSIS — Z6841 Body Mass Index (BMI) 40.0 and over, adult: Secondary | ICD-10-CM | POA: Diagnosis not present

## 2022-10-12 MED ORDER — WEGOVY 0.25 MG/0.5ML ~~LOC~~ SOAJ
0.5000 mL | SUBCUTANEOUS | 0 refills | Status: DC
  Filled 2022-10-12: qty 2, 28d supply, fill #0

## 2022-10-12 MED ORDER — NITROFURANTOIN MONOHYD MACRO 100 MG PO CAPS
100.0000 mg | ORAL_CAPSULE | Freq: Two times a day (BID) | ORAL | 0 refills | Status: DC
  Filled 2022-10-12: qty 6, 3d supply, fill #0

## 2022-10-20 ENCOUNTER — Other Ambulatory Visit: Payer: Self-pay

## 2022-10-20 MED ORDER — NITROFURANTOIN MONOHYD MACRO 100 MG PO CAPS
100.0000 mg | ORAL_CAPSULE | Freq: Two times a day (BID) | ORAL | 0 refills | Status: DC
  Filled 2022-10-20: qty 6, 3d supply, fill #0

## 2022-10-21 ENCOUNTER — Other Ambulatory Visit: Payer: Self-pay

## 2022-11-04 ENCOUNTER — Other Ambulatory Visit: Payer: Self-pay

## 2022-11-04 MED ORDER — PHENTERMINE HCL 30 MG PO CAPS
30.0000 mg | ORAL_CAPSULE | Freq: Every morning | ORAL | 2 refills | Status: DC
  Filled 2022-11-04: qty 30, 30d supply, fill #0
  Filled 2022-12-05: qty 30, 30d supply, fill #1
  Filled 2023-01-05: qty 30, 30d supply, fill #2

## 2022-11-04 MED ORDER — TOPIRAMATE 25 MG PO TABS
25.0000 mg | ORAL_TABLET | Freq: Two times a day (BID) | ORAL | 2 refills | Status: DC
  Filled 2022-11-04: qty 60, 30d supply, fill #0
  Filled 2022-12-05: qty 60, 30d supply, fill #1
  Filled 2023-01-05: qty 60, 30d supply, fill #2

## 2022-12-06 ENCOUNTER — Other Ambulatory Visit: Payer: Self-pay

## 2022-12-13 DIAGNOSIS — R399 Unspecified symptoms and signs involving the genitourinary system: Secondary | ICD-10-CM | POA: Diagnosis not present

## 2022-12-21 ENCOUNTER — Other Ambulatory Visit: Payer: Self-pay

## 2022-12-21 MED ORDER — NITROFURANTOIN MONOHYD MACRO 100 MG PO CAPS
100.0000 mg | ORAL_CAPSULE | Freq: Two times a day (BID) | ORAL | 0 refills | Status: AC
  Filled 2022-12-21: qty 10, 5d supply, fill #0

## 2023-01-04 DIAGNOSIS — J301 Allergic rhinitis due to pollen: Secondary | ICD-10-CM | POA: Diagnosis not present

## 2023-01-04 DIAGNOSIS — E66813 Obesity, class 3: Secondary | ICD-10-CM | POA: Diagnosis not present

## 2023-01-04 DIAGNOSIS — Z9884 Bariatric surgery status: Secondary | ICD-10-CM | POA: Diagnosis not present

## 2023-01-04 DIAGNOSIS — Z6841 Body Mass Index (BMI) 40.0 and over, adult: Secondary | ICD-10-CM | POA: Diagnosis not present

## 2023-01-04 DIAGNOSIS — L03031 Cellulitis of right toe: Secondary | ICD-10-CM | POA: Diagnosis not present

## 2023-01-04 DIAGNOSIS — R030 Elevated blood-pressure reading, without diagnosis of hypertension: Secondary | ICD-10-CM | POA: Diagnosis not present

## 2023-02-07 ENCOUNTER — Other Ambulatory Visit: Payer: Self-pay

## 2023-02-07 MED ORDER — TOPIRAMATE 25 MG PO TABS
25.0000 mg | ORAL_TABLET | Freq: Two times a day (BID) | ORAL | 2 refills | Status: DC
  Filled 2023-02-07: qty 60, 30d supply, fill #0
  Filled 2023-03-29: qty 60, 30d supply, fill #1
  Filled 2023-05-11: qty 60, 30d supply, fill #2

## 2023-02-08 ENCOUNTER — Other Ambulatory Visit: Payer: Self-pay

## 2023-02-10 ENCOUNTER — Other Ambulatory Visit: Payer: Self-pay

## 2023-02-10 MED ORDER — PHENTERMINE HCL 30 MG PO CAPS
30.0000 mg | ORAL_CAPSULE | Freq: Every morning | ORAL | 3 refills | Status: DC
  Filled 2023-02-10: qty 30, 30d supply, fill #0
  Filled 2023-03-13: qty 30, 30d supply, fill #1
  Filled 2023-04-13: qty 30, 30d supply, fill #2
  Filled 2023-05-11: qty 30, 30d supply, fill #3

## 2023-03-04 ENCOUNTER — Other Ambulatory Visit: Payer: Self-pay

## 2023-03-04 MED ORDER — CYANOCOBALAMIN 1000 MCG/ML IJ SOLN
1000.0000 ug | INTRAMUSCULAR | 3 refills | Status: DC
  Filled 2023-03-04: qty 3, 90d supply, fill #0
  Filled 2023-06-07: qty 3, 90d supply, fill #1
  Filled 2023-09-14: qty 3, 90d supply, fill #2
  Filled 2023-12-12: qty 3, 90d supply, fill #3

## 2023-03-14 ENCOUNTER — Other Ambulatory Visit: Payer: Self-pay

## 2023-04-13 ENCOUNTER — Other Ambulatory Visit: Payer: Self-pay

## 2023-04-14 DIAGNOSIS — Z9884 Bariatric surgery status: Secondary | ICD-10-CM | POA: Diagnosis not present

## 2023-04-14 DIAGNOSIS — Z6841 Body Mass Index (BMI) 40.0 and over, adult: Secondary | ICD-10-CM | POA: Diagnosis not present

## 2023-04-14 DIAGNOSIS — E66813 Obesity, class 3: Secondary | ICD-10-CM | POA: Diagnosis not present

## 2023-05-09 ENCOUNTER — Other Ambulatory Visit: Payer: Self-pay

## 2023-05-09 MED ORDER — FAMOTIDINE 20 MG PO TABS
20.0000 mg | ORAL_TABLET | Freq: Two times a day (BID) | ORAL | 3 refills | Status: AC | PRN
  Filled 2023-05-09: qty 180, 90d supply, fill #0
  Filled 2023-09-14: qty 180, 90d supply, fill #1
  Filled 2023-12-11: qty 180, 90d supply, fill #2
  Filled 2024-03-12: qty 180, 90d supply, fill #3

## 2023-05-11 ENCOUNTER — Other Ambulatory Visit: Payer: Self-pay

## 2023-06-07 ENCOUNTER — Other Ambulatory Visit: Payer: Self-pay

## 2023-06-07 MED ORDER — FLUTICASONE PROPIONATE 50 MCG/ACT NA SUSP
NASAL | 2 refills | Status: AC
  Filled 2023-06-07: qty 48, 90d supply, fill #0

## 2023-06-17 ENCOUNTER — Other Ambulatory Visit: Payer: Self-pay

## 2023-06-17 MED ORDER — PHENTERMINE HCL 30 MG PO CAPS
30.0000 mg | ORAL_CAPSULE | Freq: Every morning | ORAL | 3 refills | Status: DC
  Filled 2023-06-17: qty 30, 30d supply, fill #0
  Filled 2023-07-19: qty 30, 30d supply, fill #1
  Filled 2023-08-17: qty 30, 30d supply, fill #2
  Filled 2023-09-19: qty 30, 30d supply, fill #3

## 2023-06-30 ENCOUNTER — Other Ambulatory Visit: Payer: Self-pay

## 2023-06-30 MED ORDER — TOPIRAMATE 25 MG PO TABS
25.0000 mg | ORAL_TABLET | Freq: Two times a day (BID) | ORAL | 2 refills | Status: AC
  Filled 2023-06-30: qty 180, 90d supply, fill #0

## 2023-07-19 ENCOUNTER — Other Ambulatory Visit: Payer: Self-pay

## 2023-08-17 ENCOUNTER — Other Ambulatory Visit: Payer: Self-pay

## 2023-09-13 ENCOUNTER — Other Ambulatory Visit: Payer: Self-pay

## 2023-09-13 DIAGNOSIS — Z1331 Encounter for screening for depression: Secondary | ICD-10-CM | POA: Diagnosis not present

## 2023-09-13 DIAGNOSIS — Z0000 Encounter for general adult medical examination without abnormal findings: Secondary | ICD-10-CM | POA: Diagnosis not present

## 2023-09-13 MED ORDER — BUPROPION HCL ER (XL) 150 MG PO TB24
150.0000 mg | ORAL_TABLET | Freq: Every day | ORAL | 11 refills | Status: AC
  Filled 2023-09-13: qty 30, 30d supply, fill #0
  Filled 2023-10-12: qty 30, 30d supply, fill #1
  Filled 2023-11-12: qty 30, 30d supply, fill #2
  Filled 2023-12-11: qty 30, 30d supply, fill #3
  Filled 2024-01-10: qty 30, 30d supply, fill #4
  Filled 2024-02-15: qty 30, 30d supply, fill #5
  Filled 2024-03-12: qty 30, 30d supply, fill #6
  Filled 2024-04-17: qty 30, 30d supply, fill #7

## 2023-09-19 ENCOUNTER — Other Ambulatory Visit: Payer: Self-pay

## 2023-09-20 ENCOUNTER — Other Ambulatory Visit: Payer: Self-pay

## 2023-09-23 ENCOUNTER — Other Ambulatory Visit: Payer: Self-pay

## 2023-09-23 MED ORDER — VALACYCLOVIR HCL 500 MG PO TABS
500.0000 mg | ORAL_TABLET | Freq: Two times a day (BID) | ORAL | 0 refills | Status: DC
  Filled 2023-09-23 – 2023-10-13 (×2): qty 6, 3d supply, fill #0

## 2023-10-04 ENCOUNTER — Other Ambulatory Visit: Payer: Self-pay

## 2023-10-13 ENCOUNTER — Other Ambulatory Visit: Payer: Self-pay

## 2023-10-20 ENCOUNTER — Other Ambulatory Visit: Payer: Self-pay

## 2023-10-20 DIAGNOSIS — Z6841 Body Mass Index (BMI) 40.0 and over, adult: Secondary | ICD-10-CM | POA: Diagnosis not present

## 2023-10-20 DIAGNOSIS — R030 Elevated blood-pressure reading, without diagnosis of hypertension: Secondary | ICD-10-CM | POA: Diagnosis not present

## 2023-10-20 DIAGNOSIS — J301 Allergic rhinitis due to pollen: Secondary | ICD-10-CM | POA: Diagnosis not present

## 2023-10-20 DIAGNOSIS — E66813 Obesity, class 3: Secondary | ICD-10-CM | POA: Diagnosis not present

## 2023-10-20 MED ORDER — PHENTERMINE HCL 30 MG PO CAPS
30.0000 mg | ORAL_CAPSULE | Freq: Every day | ORAL | 0 refills | Status: DC
  Filled 2023-10-20 (×2): qty 30, 30d supply, fill #0

## 2023-11-01 DIAGNOSIS — Z30432 Encounter for removal of intrauterine contraceptive device: Secondary | ICD-10-CM | POA: Diagnosis not present

## 2023-11-21 ENCOUNTER — Other Ambulatory Visit: Payer: Self-pay

## 2023-11-21 MED ORDER — VALACYCLOVIR HCL 500 MG PO TABS
500.0000 mg | ORAL_TABLET | Freq: Two times a day (BID) | ORAL | 0 refills | Status: AC
  Filled 2023-11-21: qty 180, 90d supply, fill #0

## 2023-12-12 ENCOUNTER — Other Ambulatory Visit: Payer: Self-pay

## 2023-12-28 DIAGNOSIS — O3680X9 Pregnancy with inconclusive fetal viability, other fetus: Secondary | ICD-10-CM | POA: Diagnosis not present

## 2023-12-28 DIAGNOSIS — Z3202 Encounter for pregnancy test, result negative: Secondary | ICD-10-CM | POA: Diagnosis not present

## 2024-03-08 DIAGNOSIS — Z3A01 Less than 8 weeks gestation of pregnancy: Secondary | ICD-10-CM | POA: Diagnosis not present

## 2024-03-08 DIAGNOSIS — O26891 Other specified pregnancy related conditions, first trimester: Secondary | ICD-10-CM | POA: Diagnosis not present

## 2024-03-08 DIAGNOSIS — A6009 Herpesviral infection of other urogenital tract: Secondary | ICD-10-CM | POA: Diagnosis not present

## 2024-03-08 DIAGNOSIS — O209 Hemorrhage in early pregnancy, unspecified: Secondary | ICD-10-CM | POA: Diagnosis not present

## 2024-03-08 DIAGNOSIS — O09511 Supervision of elderly primigravida, first trimester: Secondary | ICD-10-CM | POA: Diagnosis not present

## 2024-03-08 DIAGNOSIS — R300 Dysuria: Secondary | ICD-10-CM | POA: Diagnosis not present

## 2024-03-08 DIAGNOSIS — O99281 Endocrine, nutritional and metabolic diseases complicating pregnancy, first trimester: Secondary | ICD-10-CM | POA: Diagnosis not present

## 2024-03-08 DIAGNOSIS — Z9884 Bariatric surgery status: Secondary | ICD-10-CM | POA: Diagnosis not present

## 2024-03-08 DIAGNOSIS — Z6791 Unspecified blood type, Rh negative: Secondary | ICD-10-CM | POA: Diagnosis not present

## 2024-03-08 DIAGNOSIS — E538 Deficiency of other specified B group vitamins: Secondary | ICD-10-CM | POA: Diagnosis not present

## 2024-03-08 DIAGNOSIS — Z3481 Encounter for supervision of other normal pregnancy, first trimester: Secondary | ICD-10-CM | POA: Diagnosis not present

## 2024-03-08 DIAGNOSIS — O98311 Other infections with a predominantly sexual mode of transmission complicating pregnancy, first trimester: Secondary | ICD-10-CM | POA: Diagnosis not present

## 2024-03-12 ENCOUNTER — Other Ambulatory Visit: Payer: Self-pay

## 2024-03-12 MED ORDER — CEPHALEXIN 500 MG PO CAPS
500.0000 mg | ORAL_CAPSULE | Freq: Four times a day (QID) | ORAL | 0 refills | Status: AC
  Filled 2024-03-12: qty 28, 7d supply, fill #0

## 2024-03-12 MED ORDER — CYANOCOBALAMIN 1000 MCG/ML IJ SOLN
1000.0000 ug | INTRAMUSCULAR | 3 refills | Status: AC
  Filled 2024-03-12: qty 3, 90d supply, fill #0

## 2024-03-28 ENCOUNTER — Other Ambulatory Visit: Payer: Self-pay

## 2024-03-28 MED ORDER — BUPROPION HCL ER (XL) 150 MG PO TB24
150.0000 mg | ORAL_TABLET | Freq: Every day | ORAL | 3 refills | Status: AC
  Filled 2024-03-28: qty 90, 90d supply, fill #0

## 2024-04-17 ENCOUNTER — Other Ambulatory Visit: Payer: Self-pay

## 2024-04-17 MED ORDER — AMOXICILLIN-POT CLAVULANATE 875-125 MG PO TABS
ORAL_TABLET | ORAL | 0 refills | Status: AC
  Filled 2024-04-17: qty 14, 7d supply, fill #0
